# Patient Record
Sex: Female | Born: 1965 | Race: White | Hispanic: No | Marital: Married | Smoking: Current every day smoker
Health system: Southern US, Community
[De-identification: ages and names within clinical notes are randomized; demographics above are authoritative.]

## PROBLEM LIST (undated history)

## (undated) DIAGNOSIS — K259 Gastric ulcer, unspecified as acute or chronic, without hemorrhage or perforation: Secondary | ICD-10-CM

## (undated) DIAGNOSIS — E7800 Pure hypercholesterolemia, unspecified: Secondary | ICD-10-CM

## (undated) DIAGNOSIS — I10 Essential (primary) hypertension: Secondary | ICD-10-CM

## (undated) DIAGNOSIS — J45909 Unspecified asthma, uncomplicated: Secondary | ICD-10-CM

## (undated) DIAGNOSIS — N809 Endometriosis, unspecified: Secondary | ICD-10-CM

## (undated) HISTORY — PX: BACK SURGERY: SHX140

## (undated) HISTORY — DX: Gastric ulcer, unspecified as acute or chronic, without hemorrhage or perforation: K25.9

## (undated) HISTORY — PX: ABDOMINAL HYSTERECTOMY: SHX81

## (undated) HISTORY — PX: OTHER SURGICAL HISTORY: SHX169

## (undated) HISTORY — DX: Endometriosis, unspecified: N80.9

## (undated) HISTORY — DX: Essential (primary) hypertension: I10

## (undated) HISTORY — DX: Pure hypercholesterolemia, unspecified: E78.00

## (undated) HISTORY — DX: Unspecified asthma, uncomplicated: J45.909

---

## 1991-08-29 DIAGNOSIS — K259 Gastric ulcer, unspecified as acute or chronic, without hemorrhage or perforation: Secondary | ICD-10-CM

## 1991-08-29 HISTORY — DX: Gastric ulcer, unspecified as acute or chronic, without hemorrhage or perforation: K25.9

## 2005-03-21 ENCOUNTER — Ambulatory Visit: Payer: Self-pay | Admitting: Physician Assistant

## 2005-10-26 ENCOUNTER — Ambulatory Visit (HOSPITAL_COMMUNITY): Admission: RE | Admit: 2005-10-26 | Discharge: 2005-10-27 | Payer: Self-pay | Admitting: Neurosurgery

## 2006-01-29 ENCOUNTER — Ambulatory Visit (HOSPITAL_COMMUNITY): Admission: RE | Admit: 2006-01-29 | Discharge: 2006-01-29 | Payer: Self-pay | Admitting: Obstetrics and Gynecology

## 2006-04-25 ENCOUNTER — Ambulatory Visit (HOSPITAL_COMMUNITY): Admission: RE | Admit: 2006-04-25 | Discharge: 2006-04-25 | Payer: Self-pay | Admitting: Nurse Practitioner

## 2007-04-22 ENCOUNTER — Ambulatory Visit (HOSPITAL_COMMUNITY): Admission: RE | Admit: 2007-04-22 | Discharge: 2007-04-22 | Payer: Self-pay | Admitting: Obstetrics and Gynecology

## 2007-06-18 ENCOUNTER — Encounter: Admission: RE | Admit: 2007-06-18 | Discharge: 2007-06-18 | Payer: Self-pay | Admitting: Neurosurgery

## 2008-04-22 ENCOUNTER — Ambulatory Visit (HOSPITAL_COMMUNITY): Admission: RE | Admit: 2008-04-22 | Discharge: 2008-04-22 | Payer: Self-pay | Admitting: Obstetrics and Gynecology

## 2009-04-26 ENCOUNTER — Ambulatory Visit (HOSPITAL_COMMUNITY): Admission: RE | Admit: 2009-04-26 | Discharge: 2009-04-26 | Payer: Self-pay | Admitting: Obstetrics and Gynecology

## 2010-06-13 ENCOUNTER — Ambulatory Visit (HOSPITAL_COMMUNITY)
Admission: RE | Admit: 2010-06-13 | Discharge: 2010-06-13 | Payer: Self-pay | Source: Home / Self Care | Admitting: Nurse Practitioner

## 2011-01-13 NOTE — Op Note (Signed)
Jessica Mendez, Jessica Mendez                ACCOUNT NO.:  [id number]   MEDICAL RECORD NO.:  [id number]          PATIENT TYPE:  AMB   LOCATION:  SDS                          FACILITY:  [facility]   PHYSICIAN:  Hewitt Shorts, M.D.DATE OF BIRTH:  [birth year]   DATE OF PROCEDURE:  10/26/2005  DATE OF DISCHARGE:                                 OPERATIVE REPORT   PREOPERATIVE DIAGNOSIS:  Right L4-5 lumbar disk herniation, lumbar  degenerative disk disease, lumbar spondylosis and lumbar radiculopathy.   POSTOPERATIVE DIAGNOSIS:  Right L4-5 lumbar disk herniation, lumbar  degenerative disk disease, lumbar spondylosis and lumbar radiculopathy.   OPERATION PERFORMED:  Right L4-5 lumbar laminotomy and microdiskectomy with  microdissection.   SURGEON:  Hewitt Shorts, M.D.   ASSISTANT:  Stefani Dama, M.D.   ANESTHESIA:  General endotracheal.   INDICATIONS FOR PROCEDURE:  The patient is a 45 year old woman who presented  with a right lumbar radiculopathy.  MRI scan revealed a right L4-5 lumbar  disk herniation.  A decision was made to proceed with laminotomy and  microdiskectomy.   DESCRIPTION OF PROCEDURE:  The patient was brought to the operating room and  placed under general endotracheal anesthesia.  The patient was turned to a  prone position.  Lumbar region was prepped with Betadine soap and solution  and draped in sterile fashion.  The midline was infiltrated with local  anesthetic with epinephrine.  A localizing x-ray was taken.  The L4-5 level  was identified and a midline incision was made over the  L4-5 level and  carried down to the subcutaneous tissue.  Bipolar cautery and electrocautery  were used to maintain hemostasis.  Dissection was carried down to the lumbar  fascia which was incised on the right side of the midline and the paraspinal  muscles were dissected from the spinous process and lamina in subperiosteal  fashion.  Another x-ray was taken and the L4-5 interlaminar  space was  identified.  The microscope was draped and the remainder of the  decompression was performed using microdissection and microsurgical  technique. A laminotomy was performed using the X-Max drill and Kerrison  punches.  The ligamentum flavum was carefully removed and we identified the  thecal sac and right L5 nerve root.  These structures were gently retracted  medially.  A large disk herniation was identified.  In fact it was more  prominent than we anticipated based on the MRI findings.  The annulus was  incised and a large fragment removed.  Spondylitic overgrowth from the  posterior aspect of L4 to L5 was removed  and we entered into the disk space  and proceeded with thorough diskectomy using variety of microcurets and  pituitary rongeurs. Good decompression was achieved, all loose fragments of  disk material were removed from both the disk space and the epidural space  and good decompression of the thecal sac and nerve root was achieved.  Once  the diskectomy was completed, hemostasis was established with the use of  Gelfoam soaked in thrombin and bipolar cautery.  All the Gelfoam was removed  prior to closure.  The wound was irrigated numerous times through the  procedure with bacitracin solution.  It was checked for hemostasis at the  conclusion.  When good hemostasis was established, we then instilled 2 mL of  fentanyl and 80 mg of Depo-Medrol into the epidural space and proceeded with  closure.  The deep fascia was closed with interrupted undyed #1 Vicryl  sutures, the Scarpa's fascia closed with interrupted undyed #1 Vicryl  sutures, the subcutaneous and subcuticular layer were closed with  interrupted inverted 2-0 undyed Vicryl sutures and skin edges closed with  Dermabond.  The procedure was tolerated well.  The estimated blood loss for  this procedure was 25 mL.  Sponge, needle and instrument counts were  correct.  Following surgery the patient was turned back to  supine position  to be reversed from anesthetic, extubated and transferred to recovery room  for further care.      Hewitt Shorts, M.D.  Electronically Signed     RWN/MEDQ  D:  10/26/2005  T:  10/26/2005  Job:  [id number]

## 2011-06-08 ENCOUNTER — Other Ambulatory Visit (HOSPITAL_COMMUNITY): Payer: Self-pay | Admitting: Internal Medicine

## 2011-06-08 DIAGNOSIS — Z139 Encounter for screening, unspecified: Secondary | ICD-10-CM

## 2011-06-20 ENCOUNTER — Ambulatory Visit (HOSPITAL_COMMUNITY)
Admission: RE | Admit: 2011-06-20 | Discharge: 2011-06-20 | Disposition: A | Discharge status: Home / Self Care | Payer: Managed Care, Other (non HMO) | Source: Ambulatory Visit | Attending: Internal Medicine | Admitting: Internal Medicine

## 2011-06-20 DIAGNOSIS — Z1231 Encounter for screening mammogram for malignant neoplasm of breast: Secondary | ICD-10-CM | POA: Insufficient documentation

## 2011-06-20 DIAGNOSIS — Z139 Encounter for screening, unspecified: Secondary | ICD-10-CM

## 2012-03-12 ENCOUNTER — Ambulatory Visit (HOSPITAL_COMMUNITY)
Admission: RE | Admit: 2012-03-12 | Discharge: 2012-03-12 | Disposition: A | Discharge status: Home / Self Care | Payer: Managed Care, Other (non HMO) | Source: Ambulatory Visit | Attending: Nurse Practitioner | Admitting: Nurse Practitioner

## 2012-03-12 ENCOUNTER — Other Ambulatory Visit (HOSPITAL_COMMUNITY): Payer: Self-pay | Admitting: Nurse Practitioner

## 2012-03-12 DIAGNOSIS — R0600 Dyspnea, unspecified: Secondary | ICD-10-CM

## 2012-03-12 DIAGNOSIS — R0609 Other forms of dyspnea: Secondary | ICD-10-CM | POA: Insufficient documentation

## 2012-03-12 DIAGNOSIS — R0989 Other specified symptoms and signs involving the circulatory and respiratory systems: Secondary | ICD-10-CM | POA: Insufficient documentation

## 2012-06-20 ENCOUNTER — Other Ambulatory Visit: Payer: Self-pay | Admitting: Obstetrics and Gynecology

## 2012-06-20 DIAGNOSIS — Z139 Encounter for screening, unspecified: Secondary | ICD-10-CM

## 2012-06-25 ENCOUNTER — Ambulatory Visit (HOSPITAL_COMMUNITY)
Admission: RE | Admit: 2012-06-25 | Discharge: 2012-06-25 | Disposition: A | Discharge status: Home / Self Care | Payer: Managed Care, Other (non HMO) | Source: Ambulatory Visit | Attending: Obstetrics and Gynecology | Admitting: Obstetrics and Gynecology

## 2012-06-25 DIAGNOSIS — Z1231 Encounter for screening mammogram for malignant neoplasm of breast: Secondary | ICD-10-CM | POA: Insufficient documentation

## 2012-06-25 DIAGNOSIS — Z139 Encounter for screening, unspecified: Secondary | ICD-10-CM

## 2013-06-10 ENCOUNTER — Other Ambulatory Visit: Payer: Self-pay | Admitting: Obstetrics and Gynecology

## 2013-06-10 DIAGNOSIS — Z139 Encounter for screening, unspecified: Secondary | ICD-10-CM

## 2013-06-30 ENCOUNTER — Ambulatory Visit (HOSPITAL_COMMUNITY)
Admission: RE | Admit: 2013-06-30 | Discharge: 2013-06-30 | Disposition: A | Discharge status: Home / Self Care | Payer: BC Managed Care – PPO | Source: Ambulatory Visit | Attending: Obstetrics and Gynecology | Admitting: Obstetrics and Gynecology

## 2013-06-30 DIAGNOSIS — Z139 Encounter for screening, unspecified: Secondary | ICD-10-CM

## 2013-06-30 DIAGNOSIS — Z1231 Encounter for screening mammogram for malignant neoplasm of breast: Secondary | ICD-10-CM | POA: Insufficient documentation

## 2014-06-19 ENCOUNTER — Other Ambulatory Visit (HOSPITAL_COMMUNITY): Payer: Self-pay | Admitting: Nurse Practitioner

## 2014-06-19 DIAGNOSIS — Z1231 Encounter for screening mammogram for malignant neoplasm of breast: Secondary | ICD-10-CM

## 2014-07-06 ENCOUNTER — Ambulatory Visit (HOSPITAL_COMMUNITY)
Admission: RE | Admit: 2014-07-06 | Discharge: 2014-07-06 | Disposition: A | Discharge status: Home / Self Care | Payer: BC Managed Care – PPO | Source: Ambulatory Visit | Attending: Nurse Practitioner | Admitting: Nurse Practitioner

## 2014-07-06 DIAGNOSIS — R921 Mammographic calcification found on diagnostic imaging of breast: Secondary | ICD-10-CM | POA: Insufficient documentation

## 2014-07-06 DIAGNOSIS — Z1231 Encounter for screening mammogram for malignant neoplasm of breast: Secondary | ICD-10-CM | POA: Insufficient documentation

## 2014-07-07 ENCOUNTER — Other Ambulatory Visit: Payer: Self-pay | Admitting: Nurse Practitioner

## 2014-07-07 DIAGNOSIS — R928 Other abnormal and inconclusive findings on diagnostic imaging of breast: Secondary | ICD-10-CM

## 2014-07-17 ENCOUNTER — Ambulatory Visit
Admission: RE | Admit: 2014-07-17 | Discharge: 2014-07-17 | Disposition: A | Discharge status: Home / Self Care | Payer: BC Managed Care – PPO | Source: Ambulatory Visit | Attending: Nurse Practitioner | Admitting: Nurse Practitioner

## 2014-07-17 ENCOUNTER — Other Ambulatory Visit: Payer: Self-pay | Admitting: Nurse Practitioner

## 2014-07-17 DIAGNOSIS — N6320 Unspecified lump in the left breast, unspecified quadrant: Secondary | ICD-10-CM

## 2014-07-17 DIAGNOSIS — R928 Other abnormal and inconclusive findings on diagnostic imaging of breast: Secondary | ICD-10-CM

## 2014-12-22 ENCOUNTER — Other Ambulatory Visit: Payer: Self-pay | Admitting: Obstetrics and Gynecology

## 2014-12-22 DIAGNOSIS — R921 Mammographic calcification found on diagnostic imaging of breast: Secondary | ICD-10-CM

## 2015-01-18 ENCOUNTER — Ambulatory Visit
Admission: RE | Admit: 2015-01-18 | Discharge: 2015-01-18 | Disposition: A | Discharge status: Home / Self Care | Payer: BLUE CROSS/BLUE SHIELD | Source: Ambulatory Visit | Attending: Obstetrics and Gynecology | Admitting: Obstetrics and Gynecology

## 2015-01-18 DIAGNOSIS — R921 Mammographic calcification found on diagnostic imaging of breast: Secondary | ICD-10-CM

## 2015-06-18 ENCOUNTER — Other Ambulatory Visit: Payer: Self-pay | Admitting: Obstetrics and Gynecology

## 2015-06-18 DIAGNOSIS — R921 Mammographic calcification found on diagnostic imaging of breast: Secondary | ICD-10-CM

## 2015-07-12 ENCOUNTER — Ambulatory Visit
Admission: RE | Admit: 2015-07-12 | Discharge: 2015-07-12 | Disposition: A | Discharge status: Home / Self Care | Payer: BLUE CROSS/BLUE SHIELD | Source: Ambulatory Visit | Attending: Obstetrics and Gynecology | Admitting: Obstetrics and Gynecology

## 2015-07-12 ENCOUNTER — Other Ambulatory Visit: Payer: Self-pay | Admitting: Obstetrics and Gynecology

## 2015-07-12 DIAGNOSIS — R921 Mammographic calcification found on diagnostic imaging of breast: Secondary | ICD-10-CM

## 2015-07-15 ENCOUNTER — Other Ambulatory Visit: Payer: Self-pay | Admitting: Obstetrics and Gynecology

## 2015-07-15 DIAGNOSIS — R921 Mammographic calcification found on diagnostic imaging of breast: Secondary | ICD-10-CM

## 2015-07-16 ENCOUNTER — Ambulatory Visit
Admission: RE | Admit: 2015-07-16 | Discharge: 2015-07-16 | Disposition: A | Discharge status: Home / Self Care | Payer: BLUE CROSS/BLUE SHIELD | Source: Ambulatory Visit | Attending: Obstetrics and Gynecology | Admitting: Obstetrics and Gynecology

## 2015-07-16 DIAGNOSIS — R921 Mammographic calcification found on diagnostic imaging of breast: Secondary | ICD-10-CM

## 2016-02-22 ENCOUNTER — Telehealth: Payer: Self-pay

## 2016-02-22 NOTE — Telephone Encounter (Signed)
Gastroenterology Pre-Procedure Review  Request Date: 02/21/2016 Requesting Physician: Lucia EstelleFeng Zheng, FNP  PATIENT REVIEW QUESTIONS: The patient responded to the following health history questions as indicated:    1. Diabetes Melitis: no 2. Joint replacements in the past 12 months: no 3. Major health problems in the past 3 months: no 4. Has an artificial valve or MVP: no 5. Has a defibrillator: no 6. Has been advised in past to take antibiotics in advance of a procedure like teeth cleaning: no 7. Family history of colon cancer: no  8. Alcohol Use: 2-3 glasses of of wine at night 3-4 times a week 9. History of sleep apnea: no     MEDICATIONS & ALLERGIES:    Patient reports the following regarding taking any blood thinners:   Plavix? no Aspirin? no Coumadin? no  Patient confirms/reports the following medications:  Current Outpatient Prescriptions  Medication Sig Dispense Refill  . albuterol (PROVENTIL HFA;VENTOLIN HFA) 108 (90 Base) MCG/ACT inhaler Inhale into the lungs every 6 (six) hours as needed for wheezing or shortness of breath.     No current facility-administered medications for this visit.    Patient confirms/reports the following allergies:  Allergies  Allergen Reactions  . Other     SULPHITES  . Penicillins     No orders of the defined types were placed in this encounter.    AUTHORIZATION INFORMATION Primary Insurance:   ID #:  Group #:  Pre-Cert / Auth required:  Pre-Cert / Auth #:   Secondary Insurance:   ID # Group #:  Pre-Cert / Auth required: Pre-Cert / Auth #:   SCHEDULE INFORMATION: Procedure has been scheduled as follows:  Date:                     Time:  Location:   This Gastroenterology Pre-Precedure Review Form is being routed to the following provider(s): R. Roetta SessionsMichael Rourk, MD

## 2016-02-23 NOTE — Telephone Encounter (Signed)
Will need OV to schedule due to possible need for augmented sedation with ETOH consumption.

## 2016-02-23 NOTE — Telephone Encounter (Signed)
Pt is aware and has been scheduled for OV with Wynne DustEric Gill, NP on 04/03/2016 at 10:30 AM.

## 2016-02-23 NOTE — Telephone Encounter (Signed)
She also said to make to put in her chart that she is allergic to preservatives. It has been entered.

## 2016-04-03 ENCOUNTER — Ambulatory Visit: Payer: BLUE CROSS/BLUE SHIELD | Admitting: Nurse Practitioner

## 2016-05-02 DIAGNOSIS — J449 Chronic obstructive pulmonary disease, unspecified: Secondary | ICD-10-CM | POA: Diagnosis not present

## 2016-05-18 DIAGNOSIS — J449 Chronic obstructive pulmonary disease, unspecified: Secondary | ICD-10-CM | POA: Diagnosis not present

## 2016-05-22 DIAGNOSIS — J449 Chronic obstructive pulmonary disease, unspecified: Secondary | ICD-10-CM | POA: Diagnosis not present

## 2016-05-31 DIAGNOSIS — J449 Chronic obstructive pulmonary disease, unspecified: Secondary | ICD-10-CM | POA: Diagnosis not present

## 2016-06-12 DIAGNOSIS — J449 Chronic obstructive pulmonary disease, unspecified: Secondary | ICD-10-CM | POA: Diagnosis not present

## 2016-06-13 DIAGNOSIS — I10 Essential (primary) hypertension: Secondary | ICD-10-CM | POA: Diagnosis not present

## 2016-06-13 DIAGNOSIS — J019 Acute sinusitis, unspecified: Secondary | ICD-10-CM | POA: Diagnosis not present

## 2016-06-16 ENCOUNTER — Other Ambulatory Visit (HOSPITAL_COMMUNITY): Payer: Self-pay | Admitting: Nurse Practitioner

## 2016-06-16 DIAGNOSIS — Z1231 Encounter for screening mammogram for malignant neoplasm of breast: Secondary | ICD-10-CM

## 2016-06-20 DIAGNOSIS — J449 Chronic obstructive pulmonary disease, unspecified: Secondary | ICD-10-CM | POA: Diagnosis not present

## 2016-06-30 DIAGNOSIS — J449 Chronic obstructive pulmonary disease, unspecified: Secondary | ICD-10-CM | POA: Diagnosis not present

## 2016-07-10 DIAGNOSIS — Z79899 Other long term (current) drug therapy: Secondary | ICD-10-CM | POA: Diagnosis not present

## 2016-07-10 DIAGNOSIS — I10 Essential (primary) hypertension: Secondary | ICD-10-CM | POA: Diagnosis not present

## 2016-07-10 DIAGNOSIS — R7301 Impaired fasting glucose: Secondary | ICD-10-CM | POA: Diagnosis not present

## 2016-07-10 DIAGNOSIS — E782 Mixed hyperlipidemia: Secondary | ICD-10-CM | POA: Diagnosis not present

## 2016-07-10 DIAGNOSIS — Z23 Encounter for immunization: Secondary | ICD-10-CM | POA: Diagnosis not present

## 2016-07-12 ENCOUNTER — Ambulatory Visit (HOSPITAL_COMMUNITY): Payer: BLUE CROSS/BLUE SHIELD

## 2016-07-13 DIAGNOSIS — J449 Chronic obstructive pulmonary disease, unspecified: Secondary | ICD-10-CM | POA: Diagnosis not present

## 2016-07-17 DIAGNOSIS — J449 Chronic obstructive pulmonary disease, unspecified: Secondary | ICD-10-CM | POA: Diagnosis not present

## 2016-07-19 ENCOUNTER — Ambulatory Visit (HOSPITAL_COMMUNITY)
Admission: RE | Admit: 2016-07-19 | Discharge: 2016-07-19 | Disposition: A | Discharge status: Home / Self Care | Payer: BLUE CROSS/BLUE SHIELD | Source: Ambulatory Visit | Attending: Nurse Practitioner | Admitting: Nurse Practitioner

## 2016-07-19 DIAGNOSIS — Z1231 Encounter for screening mammogram for malignant neoplasm of breast: Secondary | ICD-10-CM | POA: Insufficient documentation

## 2016-07-26 DIAGNOSIS — J0110 Acute frontal sinusitis, unspecified: Secondary | ICD-10-CM | POA: Diagnosis not present

## 2016-08-14 DIAGNOSIS — J449 Chronic obstructive pulmonary disease, unspecified: Secondary | ICD-10-CM | POA: Diagnosis not present

## 2016-09-04 DIAGNOSIS — J449 Chronic obstructive pulmonary disease, unspecified: Secondary | ICD-10-CM | POA: Diagnosis not present

## 2016-09-18 DIAGNOSIS — J309 Allergic rhinitis, unspecified: Secondary | ICD-10-CM | POA: Diagnosis not present

## 2016-09-18 DIAGNOSIS — J449 Chronic obstructive pulmonary disease, unspecified: Secondary | ICD-10-CM | POA: Diagnosis not present

## 2016-09-18 DIAGNOSIS — R0902 Hypoxemia: Secondary | ICD-10-CM | POA: Diagnosis not present

## 2016-09-18 DIAGNOSIS — R0602 Shortness of breath: Secondary | ICD-10-CM | POA: Diagnosis not present

## 2016-09-20 DIAGNOSIS — J449 Chronic obstructive pulmonary disease, unspecified: Secondary | ICD-10-CM | POA: Diagnosis not present

## 2016-10-04 DIAGNOSIS — J449 Chronic obstructive pulmonary disease, unspecified: Secondary | ICD-10-CM | POA: Diagnosis not present

## 2016-10-16 DIAGNOSIS — J449 Chronic obstructive pulmonary disease, unspecified: Secondary | ICD-10-CM | POA: Diagnosis not present

## 2016-10-17 DIAGNOSIS — J449 Chronic obstructive pulmonary disease, unspecified: Secondary | ICD-10-CM | POA: Diagnosis not present

## 2016-10-18 DIAGNOSIS — J0191 Acute recurrent sinusitis, unspecified: Secondary | ICD-10-CM | POA: Diagnosis not present

## 2016-10-23 ENCOUNTER — Ambulatory Visit (INDEPENDENT_AMBULATORY_CARE_PROVIDER_SITE_OTHER): Payer: BLUE CROSS/BLUE SHIELD | Admitting: Adult Health

## 2016-10-23 ENCOUNTER — Encounter: Payer: Self-pay | Admitting: Adult Health

## 2016-10-23 VITALS — BP 140/78 | HR 84 | Ht 65.0 in | Wt 221.0 lb

## 2016-10-23 DIAGNOSIS — R319 Hematuria, unspecified: Secondary | ICD-10-CM

## 2016-10-23 DIAGNOSIS — G479 Sleep disorder, unspecified: Secondary | ICD-10-CM | POA: Diagnosis not present

## 2016-10-23 DIAGNOSIS — R61 Generalized hyperhidrosis: Secondary | ICD-10-CM

## 2016-10-23 DIAGNOSIS — R232 Flushing: Secondary | ICD-10-CM | POA: Diagnosis not present

## 2016-10-23 DIAGNOSIS — R6882 Decreased libido: Secondary | ICD-10-CM

## 2016-10-23 DIAGNOSIS — N3946 Mixed incontinence: Secondary | ICD-10-CM

## 2016-10-23 LAB — POCT URINALYSIS DIPSTICK
GLUCOSE UA: NEGATIVE
Ketones, UA: NEGATIVE
Leukocytes, UA: NEGATIVE
Nitrite, UA: NEGATIVE
PROTEIN UA: NEGATIVE

## 2016-10-23 MED ORDER — ESTRADIOL 1 MG PO TABS: 1.0000 mg | ORAL_TABLET | Freq: Every day | ORAL | 3 refills | Status: DC

## 2016-10-23 MED ORDER — OXYBUTYNIN CHLORIDE ER 10 MG PO TB24: 10.0000 mg | ORAL_TABLET | Freq: Every day | ORAL | 3 refills | Status: DC

## 2016-10-23 NOTE — Patient Instructions (Addendum)
Decrease smoking  Decrease caffeine Decrease alcohol Void before going to bed  Do kegels Follow up with me in 2 months  Menopause Menopause is the normal time of life when menstrual periods stop completely. Menopause is complete when you have missed 12 consecutive menstrual periods. It usually occurs between the ages of 48 years and 55 years. Very rarely does a woman develop menopause before the age of 40 years. At menopause, your ovaries stop producing the female hormones estrogen and progesterone. This can cause undesirable symptoms and also affect your health. Sometimes the symptoms may occur 4-5 years before the menopause begins. There is no relationship between menopause and:  Oral contraceptives.  Number of children you had.  Race.  The age your menstrual periods started (menarche). Heavy smokers and very thin women may develop menopause earlier in life. What are the causes?  The ovaries stop producing the female hormones estrogen and progesterone. Other causes include:  Surgery to remove both ovaries.  The ovaries stop functioning for no known reason.  Tumors of the pituitary gland in the brain.  Medical disease that affects the ovaries and hormone production.  Radiation treatment to the abdomen or pelvis.  Chemotherapy that affects the ovaries. What are the signs or symptoms?  Hot flashes.  Night sweats.  Decrease in sex drive.  Vaginal dryness and thinning of the vagina causing painful intercourse.  Dryness of the skin and developing wrinkles.  Headaches.  Tiredness.  Irritability.  Memory problems.  Weight gain.  Bladder infections.  Hair growth of the face and chest.  Infertility. More serious symptoms include:  Loss of bone (osteoporosis) causing breaks (fractures).  Depression.  Hardening and narrowing of the arteries (atherosclerosis) causing heart attacks and strokes. How is this diagnosed?  When the menstrual periods have stopped  for 12 straight months.  Physical exam.  Hormone studies of the blood. How is this treated? There are many treatment choices and nearly as many questions about them. The decisions to treat or not to treat menopausal changes is an individual choice made with your health care provider. Your health care provider can discuss the treatments with you. Together, you can decide which treatment will work best for you. Your treatment choices may include:  Hormone therapy (estrogen and progesterone).  Non-hormonal medicines.  Treating the individual symptoms with medicine (for example antidepressants for depression).  Herbal medicines that may help specific symptoms.  Counseling by a psychiatrist or psychologist.  Group therapy.  Lifestyle changes including:  Eating healthy.  Regular exercise.  Limiting caffeine and alcohol.  Stress management and meditation.  No treatment. Follow these instructions at home:  Take the medicine your health care provider gives you as directed.  Get plenty of sleep and rest.  Exercise regularly.  Eat a diet that contains calcium (good for the bones) and soy products (acts like estrogen hormone).  Avoid alcoholic beverages.  Do not smoke.  If you have hot flashes, dress in layers.  Take supplements, calcium, and vitamin D to strengthen bones.  You can use over-the-counter lubricants or moisturizers for vaginal dryness.  Group therapy is sometimes very helpful.  Acupuncture may be helpful in some cases. Contact a health care provider if:  You are not sure you are in menopause.  You are having menopausal symptoms and need advice and treatment.  You are still having menstrual periods after age 65 years.  You have pain with intercourse.  Menopause is complete (no menstrual period for 12 months) and you develop  vaginal bleeding.  You need a referral to a specialist (gynecologist, psychiatrist, or psychologist) for treatment. Get help  right away if:  You have severe depression.  You have excessive vaginal bleeding.  You fell and think you have a broken bone.  You have pain when you urinate.  You develop leg or chest pain.  You have a fast pounding heart beat (palpitations).  You have severe headaches.  You develop vision problems.  You feel a lump in your breast.  You have abdominal pain or severe indigestion. This information is not intended to replace advice given to you by your health care provider. Make sure you discuss any questions you have with your health care provider. Document Released: 11/04/2003 Document Revised: 01/20/2016 Document Reviewed: 03/13/2013 Elsevier Interactive Patient Education  2017 [employer] Kegel Exercises The goal of Kegel exercises is to isolate and exercise your pelvic floor muscles. These muscles act as a hammock that supports the rectum, vagina, small intestine, and uterus. As the muscles weaken, the hammock sags and these organs are displaced from their normal positions. Kegel exercises can strengthen your pelvic floor muscles and help you to improve bladder and bowel control, improve sexual response, and help reduce many problems and some discomfort during pregnancy. Kegel exercises can be done anywhere and at any time. HOW TO PERFORM KEGEL EXERCISES 1. Locate your pelvic floor muscles. To do this, squeeze (contract) the muscles that you use when you try to stop the flow of urine. You will feel a tightness in the vaginal area (women) and a tight lift in the rectal area (men and women). 2. When you begin, contract your pelvic muscles tight for 2-5 seconds, then relax them for 2-5 seconds. This is one set. Do 4-5 sets with a short pause in between. 3. Contract your pelvic muscles for 8-10 seconds, then relax them for 8-10 seconds. Do 4-5 sets. If you cannot contract your pelvic muscles for 8-10 seconds, try 5-7 seconds and work your way up to 8-10 seconds. Your goal is 4-5 sets  of 10 contractions each day. Keep your stomach, buttocks, and legs relaxed during the exercises. Perform sets of both short and long contractions. Vary your positions. Perform these contractions 3-4 times per day. Perform sets while you are:   Lying in bed in the morning.  Standing at lunch.  Sitting in the late afternoon.  Lying in bed at night. You should do 40-50 contractions per day. Do not perform more Kegel exercises per day than recommended. Overexercising can cause muscle fatigue. Continue these exercises for for at least 15-20 weeks or as directed by your caregiver. This information is not intended to replace advice given to you by your health care provider. Make sure you discuss any questions you have with your health care provider. Document Released: 07/31/2012 Document Revised: 09/04/2014 Document Reviewed: 07/04/2015 Elsevier Interactive Patient Education  2017 [employer]

## 2016-10-23 NOTE — Progress Notes (Signed)
Subjective:     Patient ID: Jessica ReichmannLisa M Mendez, female   DOB: 10-05-1965, 51 y.o.   MRN: [id number]  HPI Jessica StanleyLisa is a 51 year old white female, married, sp hysterectomy in complaining of bladder leaking when coughs or sneezes, or has to go, may be up 2-3 x during the night to pee, has decreased sex drive, no pain with sex.Also hot flashes for 2 years and night sweats and does not sleep well. No itching or burning.She is sp hysterectomy but has 1 ovary left.And she is a smoker. [facility]  Review of Systems +bladder leaks when coughs,sneezes, or if has to go May be up at night 2-3 x to pee Hot flashes and night sweats Not sleeping good  +vaginal discharge at times,no itching or burning  Decrease sex drive,no pain with sex Reviewed past medical,surgical, social and family history. Reviewed medications and allergies.     Objective:   Physical Exam BP 140/78 (BP Location: Left Arm, Patient Position: Sitting, Cuff Size: Large)   Pulse 84   Ht 5\' 5"  (1.651 m)   Wt 221 lb (100.2 kg)   BMI 36.78 kg/m Urine dipstick trace blood. PHQ 2 score 0. Skin warm and dry.Pelvic: external genitalia is normal in appearance no lesions, vagina: scant white discharge without odor,urethra has no lesions or masses noted, cervix and uterus are absent, adnexa: no masses or tenderness noted. Bladder is non tender and no masses felt.   Discussed adding estrogen and ditropan and kegels, and she agrees, is aware of risks and benefits, and that getting sex back is the hardest to accomplish. May add testosterone if not better at F/U. Face time 20 minutes with 50% counseling and discuss options.  Assessment:     1. Mixed stress and urge urinary incontinence   2. Hot flashes   3. Hematuria, unspecified type   4. Night sweats   5. Sleep disturbance   6. Decreased sex drive       Plan:    Rx estrace 1 mg #30 take 1 daily with 3 refills Rx ditropan XL 10 mg take 1 at hs UA C&S sent Follow up in 2  months Decrease smoking  Decrease caffeine Decrease alcohol Void before  going to bed  Do kegels Have sex at least once a week after being on estrace for at least 2 weeks Review handout on kegels and menopause

## 2016-10-24 LAB — URINALYSIS, ROUTINE W REFLEX MICROSCOPIC
Bilirubin, UA: NEGATIVE
Glucose, UA: NEGATIVE
Ketones, UA: NEGATIVE
Leukocytes, UA: NEGATIVE
Nitrite, UA: NEGATIVE
Protein, UA: NEGATIVE
RBC, UA: NEGATIVE
Specific Gravity, UA: 1.009 (ref 1.005–1.030)
Urobilinogen, Ur: 0.2 mg/dL (ref 0.2–1.0)
pH, UA: 8 — ABNORMAL HIGH (ref 5.0–7.5)

## 2016-10-25 ENCOUNTER — Telehealth: Payer: Self-pay | Admitting: Adult Health

## 2016-10-25 LAB — URINE CULTURE

## 2016-10-25 NOTE — Telephone Encounter (Signed)
Pt aware urine culture did not grow anaything

## 2016-10-30 DIAGNOSIS — J449 Chronic obstructive pulmonary disease, unspecified: Secondary | ICD-10-CM | POA: Diagnosis not present

## 2016-11-09 DIAGNOSIS — J449 Chronic obstructive pulmonary disease, unspecified: Secondary | ICD-10-CM | POA: Diagnosis not present

## 2016-11-13 DIAGNOSIS — J449 Chronic obstructive pulmonary disease, unspecified: Secondary | ICD-10-CM | POA: Diagnosis not present

## 2016-11-27 DIAGNOSIS — J449 Chronic obstructive pulmonary disease, unspecified: Secondary | ICD-10-CM | POA: Diagnosis not present

## 2016-12-04 DIAGNOSIS — J449 Chronic obstructive pulmonary disease, unspecified: Secondary | ICD-10-CM | POA: Diagnosis not present

## 2016-12-11 DIAGNOSIS — J449 Chronic obstructive pulmonary disease, unspecified: Secondary | ICD-10-CM | POA: Diagnosis not present

## 2016-12-21 DIAGNOSIS — J449 Chronic obstructive pulmonary disease, unspecified: Secondary | ICD-10-CM | POA: Diagnosis not present

## 2016-12-25 ENCOUNTER — Encounter: Payer: Self-pay | Admitting: Adult Health

## 2016-12-25 ENCOUNTER — Ambulatory Visit (INDEPENDENT_AMBULATORY_CARE_PROVIDER_SITE_OTHER): Payer: BLUE CROSS/BLUE SHIELD | Admitting: Adult Health

## 2016-12-25 VITALS — BP 128/82 | HR 70 | Ht 65.0 in | Wt 221.0 lb

## 2016-12-25 DIAGNOSIS — N3946 Mixed incontinence: Secondary | ICD-10-CM | POA: Diagnosis not present

## 2016-12-25 DIAGNOSIS — J449 Chronic obstructive pulmonary disease, unspecified: Secondary | ICD-10-CM | POA: Diagnosis not present

## 2016-12-25 MED ORDER — ESTRADIOL 1 MG PO TABS: 1.0000 mg | ORAL_TABLET | Freq: Every day | ORAL | 12 refills | Status: DC

## 2016-12-25 MED ORDER — TOLTERODINE TARTRATE ER 4 MG PO CP24: 4.0000 mg | ORAL_CAPSULE | Freq: Every day | ORAL | 1 refills | Status: DC

## 2016-12-25 NOTE — Progress Notes (Signed)
Subjective:     Patient ID: Jessica Mendez, female   DOB: 01/05/1966, 51 y.o.   MRN: [id number]  HPI Jessica Mendez is a 51 year old white female, back in follow up of starting estrogen and ditropan.Hot flashes and night sweats gone and sleeping better, but still has UI, may even but worse esp with coughing and sneezing.Has dry mouth with ditropan.  Review of Systems +urinary incontinence esp with coughing and sneezing  Dry mouth Hot flashes and night sweats gone, sleeping better, and working on sex  Reviewed past medical,surgical, social and family history. Reviewed medications and allergies.     Objective:   Physical Exam BP 128/82 (BP Location: Right Arm, Patient Position: Sitting, Cuff Size: Normal)   Pulse 70   Ht  (1.651 m)   Wt 221 lb (100.2 kg)   BMI 36.78 kg/m  Skin warm and dry.Lungs: clear to ausculation bilaterally. Cardiovascular: regular rate and rhythm.   Will stop ditropan and try detrol if no help then myrbetriq, or urology referral.  Assessment:     1. Mixed stress and urge urinary incontinence       Plan:     Meds ordered this encounter  Medications  . tolterodine (DETROL LA) 4 MG 24 hr capsule    Sig: Take 1 capsule (4 mg total) by mouth daily.    Dispense:  30 capsule    Refill:  1    Order Specific Question:   Supervising Provider    Answer:   Despina Hidden, LUTHER H [[id number]]  . estradiol (ESTRACE) 1 MG tablet    Sig: Take 1 tablet (1 mg total) by mouth daily.    Dispense:  30 tablet    Refill:  12    Order Specific Question:   Supervising Provider    Answer:   Duane Lope H [[id number]]  Stop oxybutynin and start detrol Do kegels still  Follow up in 7 weeks

## 2017-01-03 DIAGNOSIS — J449 Chronic obstructive pulmonary disease, unspecified: Secondary | ICD-10-CM | POA: Diagnosis not present

## 2017-01-08 DIAGNOSIS — J449 Chronic obstructive pulmonary disease, unspecified: Secondary | ICD-10-CM | POA: Diagnosis not present

## 2017-01-17 DIAGNOSIS — R1084 Generalized abdominal pain: Secondary | ICD-10-CM | POA: Diagnosis not present

## 2017-01-17 DIAGNOSIS — R197 Diarrhea, unspecified: Secondary | ICD-10-CM | POA: Diagnosis not present

## 2017-01-19 DIAGNOSIS — R599 Enlarged lymph nodes, unspecified: Secondary | ICD-10-CM | POA: Diagnosis not present

## 2017-01-19 DIAGNOSIS — R911 Solitary pulmonary nodule: Secondary | ICD-10-CM | POA: Diagnosis not present

## 2017-01-19 DIAGNOSIS — R197 Diarrhea, unspecified: Secondary | ICD-10-CM | POA: Diagnosis not present

## 2017-01-19 DIAGNOSIS — R1084 Generalized abdominal pain: Secondary | ICD-10-CM | POA: Diagnosis not present

## 2017-01-23 DIAGNOSIS — J449 Chronic obstructive pulmonary disease, unspecified: Secondary | ICD-10-CM | POA: Diagnosis not present

## 2017-01-24 ENCOUNTER — Encounter: Payer: Self-pay | Admitting: Internal Medicine

## 2017-01-29 ENCOUNTER — Ambulatory Visit (INDEPENDENT_AMBULATORY_CARE_PROVIDER_SITE_OTHER): Payer: BLUE CROSS/BLUE SHIELD | Admitting: Nurse Practitioner

## 2017-01-29 ENCOUNTER — Other Ambulatory Visit: Payer: Self-pay

## 2017-01-29 ENCOUNTER — Encounter: Payer: Self-pay | Admitting: Nurse Practitioner

## 2017-01-29 DIAGNOSIS — R109 Unspecified abdominal pain: Secondary | ICD-10-CM | POA: Insufficient documentation

## 2017-01-29 DIAGNOSIS — R1084 Generalized abdominal pain: Secondary | ICD-10-CM | POA: Diagnosis not present

## 2017-01-29 DIAGNOSIS — K921 Melena: Secondary | ICD-10-CM

## 2017-01-29 DIAGNOSIS — R197 Diarrhea, unspecified: Secondary | ICD-10-CM

## 2017-01-29 DIAGNOSIS — J449 Chronic obstructive pulmonary disease, unspecified: Secondary | ICD-10-CM | POA: Diagnosis not present

## 2017-01-29 MED ORDER — PEG 3350-KCL-NA BICARB-NACL 420 G PO SOLR: 4000.0000 mL | ORAL | 0 refills | Status: DC

## 2017-01-29 NOTE — Assessment & Plan Note (Signed)
The patient became ill about 2 weeks ago, now starting her third week of illness. Initially she had significant GI cramping. However, after treatment from her primary care her cramping is essentially resolved. States she has "occasional twinges" but no overt pain. Further workup as per below. Return for follow-up in 3 months.

## 2017-01-29 NOTE — Progress Notes (Signed)
Primary Care Physician:  Erasmo Downer, NP Primary Gastroenterologist:  Dr. Jena Gauss  Chief Complaint  Patient presents with  . Diarrhea    had tcs >20 yrs ago  . abnormal CT  . Nausea    has to take Zofran to eat  . Bloated    after eating    HPI:   Jessica Mendez is a 51 y.o. female who presents on evaluation from primary care for abnormal CT and diarrhea. PCP notes reviewed. Patient last saw primary care on 01/17/2017 at which point she described vomiting and diarrhea with vomiting subsiding but continued with diarrhea every 1-2 hours as well as abdominal cramping pain which is getting worse and a low-grade fever. Stools have been black but has recently taken Pepto-Bismol for pain. Taking sips liquids but otherwise no significant oral intake. Recommended colonoscopy but the patient had not scheduled as of 09/13/2016. Stat CT of the abdomen and pelvis found enteritis involving the mid to distal portions of the ileum with differential considerations being inflammatory versus infectious etiologies, does not appear to be enhancement of the wall and vascular compromise cannot be excluded though is of lower differential consideration. No associated loculated fluid collections or free air, free fluid is demonstrated within the lower abdomen and pelvis. Noted hepatic steatosis. Likely reactive peripancreatic lymph nodes.  Labs also reviewed and CBC found hemoglobin normal at 15.7 white blood cell count normal at 7.2, CMP with normal creatinine 0.79 mildly elevated AST at 49 but normal ALT and other liver labs. Lipase normal at 172. Lipid panel elevated/high risk across the board. Hemoglobin A1c 5.3.  Today she states she's not feeling good, has been feeling bad for about 2-3 weeks. The first weekl included vomiting, diarrhea, and abdominal cramping. Was put on antibiotic and cramps improved, diarrhea somewhat improved but not resolved. Still having nausea but no vomiting. Zofran helps her  nausea. When she eats feels bloated and "miserable." Denies recent fevers, but did have a low grade fever when she first fell ill. Denies hematochezia. Did have black stools "for a while" (on and off for a year or more) and during her recent illness did take Pepto Bismol twice. Denies unintentional weight loss. Prior to illness had variable stools from soft/formed to loose. Had a colonoscopy in [place], [place] around 20-30 years ago in addition to EGD and found a stomach ulcer. Denies NSAIDs, takes BC powder "every now and then" about 2 times a week. Denies chest pain, dyspnea, dizziness, lightheadedness, syncope, near syncope. Denies any other upper or lower GI symptoms.  Past Medical History:  Diagnosis Date  . Asthma   . Endometriosis   . Hypertension     Past Surgical History:  Procedure Laterality Date  . ABDOMINAL HYSTERECTOMY    . BACK SURGERY    . CESAREAN SECTION    . Sinus Surgeries     x 5    Current Outpatient Prescriptions  Medication Sig Dispense Refill  . EPINEPHrine 0.3 mg/0.3 mL IJ SOAJ injection Inject 0.3 mg into the muscle.    . estradiol (ESTRACE) 1 MG tablet Take 1 tablet (1 mg total) by mouth daily. 30 tablet 12  . Fluticasone-Salmeterol (ADVAIR DISKUS) 500-50 MCG/DOSE AEPB Inhale 1 puff into the lungs 2 (two) times daily.     . hydrochlorothiazide (MICROZIDE) 12.5 MG capsule Take 12.5 mg by mouth daily.   4  . ondansetron (ZOFRAN-ODT) 8 MG disintegrating tablet Take 8 mg by mouth as needed.  0  .  PROAIR RESPICLICK 108 (90 Base) MCG/ACT AEPB 2 puffs as needed.   4  . tolterodine (DETROL LA) 4 MG 24 hr capsule Take 1 capsule (4 mg total) by mouth daily. 30 capsule 1  . UNABLE TO FIND Allergy shots-every 2 weeks     No current facility-administered medications for this visit.     Allergies as of 01/29/2017 - Review Complete 01/29/2017  Allergen Reaction Noted  . Other  02/22/2016  . Penicillins  02/22/2016  . Procaine Shortness Of Breath and Swelling 09/21/2014    . Sulfa antibiotics  09/21/2014    Family History  Problem Relation Age of Onset  . Heart attack Paternal Grandfather   . Cancer Paternal Grandmother   . Colon cancer Maternal Grandmother   . COPD Maternal Grandfather   . Lymphoma Father   . Hypertension Father   . Hypertension Mother   . Asthma Sister   . Other Sister        back surgery  . Hypertension Sister     Social History   Social History  . Marital status: Married    Spouse name: N/A  . Number of children: N/A  . Years of education: N/A   Occupational History  . Not on file.   Social History Main Topics  . Smoking status: Current Every Day Smoker    Packs/day: 1.00    Years: 20.00    Types: Cigarettes  . Smokeless tobacco: Never Used  . Alcohol use Yes     Comment: occ  . Drug use: No  . Sexual activity: Yes    Birth control/ protection: Surgical     Comment: hyst   Other Topics Concern  . Not on file   Social History Narrative  . No narrative on file    Review of Systems: General: Negative for anorexia, weight loss, fever, chills, fatigue, weakness. ENT: Negative for hoarseness, difficulty swallowing. CV: Negative for chest pain, angina, palpitations, peripheral edema.  Respiratory: Negative for dyspnea at rest, cough, sputum, wheezing.  GI: See history of present illness. MS: Negative for joint pain, low back pain.  Derm: Negative for rash or itching.  Endo: Negative for unusual weight change.  Heme: Negative for bruising or bleeding. Allergy: Negative for rash or hives.    Physical Exam: BP 136/88   Pulse 71   Temp 97 F (36.1 C) (Oral)   Ht 5\' 5"  (1.651 m)   Wt 214 lb 9.6 oz (97.3 kg)   BMI 35.71 kg/m  General:   Obese female. Alert and oriented. Pleasant and cooperative. Well-nourished and well-developed.  Head:  Normocephalic and atraumatic. Eyes:  Without icterus, sclera clear and conjunctiva pink.  Ears:  Normal auditory acuity. Cardiovascular:  S1, S2 present without  murmurs appreciated. Extremities without clubbing or edema. Respiratory:  Clear to auscultation bilaterally. No wheezes, rales, or rhonchi. No distress.  Gastrointestinal:  +BS, soft, non-tender and non-distended. No HSM noted. No guarding or rebound. No masses appreciated.  Rectal:  Deferred  Musculoskalatal:  Symmetrical without gross deformities. Neurologic:  Grossly normal neurologically. Psych:  Alert and cooperative. Normal mood and affect. Heme/Lymph/Immune: No excessive bruising noted.    01/29/2017 8:59 AM   Disclaimer: This note was dictated with voice recognition software. Similar sounding words can inadvertently be transcribed and may not be corrected upon review.

## 2017-01-29 NOTE — Assessment & Plan Note (Addendum)
The patient describes black stools on and off for the past year. Has a history of gastric ulcer about 25 years ago. She did take 2 doses of Pepto-Bismol during her recent acute illness but has had the black stools intermittently for longer than that. She does take BC powder about twice a week. I have counseled her to stop NSAIDs and aspirin powders as these can cause ulcers and other complications. At this time, given that she is due for colonoscopy, we will set up an upper endoscopy for further evaluation as well. She may need to be placed on a PPI pending results of her EGD. Labs provided by primary care indicate her hemoglobin is normal and she has not noted any blood in her stools, only "black stools" as described above. Return for follow-up in 3 months.  Proceed with EGD with Dr. Jena Gaussourk in near future: the risks, benefits, and alternatives have been discussed with the patient in detail. The patient states understanding and desires to proceed.  The patient is not on any anticoagulants, anxiolytics, chronic pain medications, or antidepressants. Conscious sedation should be adequate for her procedure  She has special sedation requirements of sedation with no preservatives due to allergies.

## 2017-01-29 NOTE — Assessment & Plan Note (Addendum)
The patient has continued diarrhea about twice a day. It is improved since when she initially became ill since she has been treated by her primary care. CT of the abdomen indicates likely gastroenteritis. I will have her bring a stool sample to the lab check a GI pathogen panel. If no persistent infection she can start Imodium as needed. Also recommend probiotic once a day for the next 1-2 months. She is 51 years old and is due for a colonoscopy at this point. We will order the colonoscopy which will allow further evaluation of her colon. She has special sedation requirements in that she is allergic to the preservatives and sedation and we will follow-up with endoscopy about this. Return for follow-up in 3 months.  Proceed with TCS with Dr. Jena Gaussourk in near future: the risks, benefits, and alternatives have been discussed with the patient in detail. The patient states understanding and desires to proceed.  The patient is not on any anticoagulants, anxiolytics, chronic pain medications, or antidepressants. Conscious sedation should be adequate for her procedure.

## 2017-01-29 NOTE — Progress Notes (Signed)
CC'ED TO PCP 

## 2017-01-29 NOTE — Patient Instructions (Addendum)
1. Take a probiotic daily for the next 30-60 days. 2. I am providing you with samples to last 10 days and a coupon to help with the over-the-counter cost. 3. We will give you cups to collect a liquid stool sample. Bring this back to the lab (not our office) when it is completed. 4. We will schedule your procedures for you. 5. We will notify endoscopy of her allergies to sedation medicine with preservatives. 6. As we discussed, diet and exercise of the standard treatments for fatty liver disease. Your CT indicates she likely due to have fatty liver disease. Further information as below. 7. Return for follow-up in 3 months.    Fatty Liver Fatty liver, also called hepatic steatosis or steatohepatitis, is a condition in which too much fat has built up in your liver cells. The liver removes harmful substances from your bloodstream. It produces fluids your body needs. It also helps your body use and store energy from the food you eat. In many cases, fatty liver does not cause symptoms or problems. It is often diagnosed when tests are being done for other reasons. However, over time, fatty liver can cause inflammation that may lead to more serious liver problems, such as scarring of the liver (cirrhosis). What are the causes? Causes of fatty liver may include:  Drinking too much alcohol.  Poor nutrition.  Obesity.  Cushing syndrome.  Diabetes.  Hyperlipidemia.  Pregnancy.  Certain drugs.  Poisons.  Some viral infections.  What increases the risk? You may be more likely to develop fatty liver if you:  Abuse alcohol.  Are pregnant.  Are overweight.  Have diabetes.  Have hepatitis.  Have a high triglyceride level.  What are the signs or symptoms? Fatty liver often does not cause any symptoms. In cases where symptoms develop, they can include:  Fatigue.  Weakness.  Weight loss.  Confusion.  Abdominal pain.  Yellowing of your skin and the white parts of your eyes  (jaundice).  Nausea and vomiting.  How is this diagnosed? Fatty liver may be diagnosed by:  Physical exam and medical history.  Blood tests.  Imaging tests, such as an ultrasound, CT scan, or MRI.  Liver biopsy. A small sample of liver tissue is removed using a needle. The sample is then looked at under a microscope.  How is this treated? Fatty liver is often caused by other health conditions. Treatment for fatty liver may involve medicines and lifestyle changes to manage conditions such as:  Alcoholism.  High cholesterol.  Diabetes.  Being overweight or obese.  Follow these instructions at home:  Eat a healthy diet as directed by your health care provider.  Exercise regularly. This can help you lose weight and control your cholesterol and diabetes. Talk to your health care provider about an exercise plan and which activities are best for you.  Do not drink alcohol.  Take medicines only as directed by your health care provider. Contact a health care provider if: You have difficulty controlling your:  Blood sugar.  Cholesterol.  Alcohol consumption.  Get help right away if:  You have abdominal pain.  You have jaundice.  You have nausea and vomiting. This information is not intended to replace advice given to you by your health care provider. Make sure you discuss any questions you have with your health care provider. Document Released: 09/29/2005 Document Revised: 01/20/2016 Document Reviewed: 12/24/2013 Elsevier Interactive Patient Education  [employer]2018 [employer]

## 2017-01-31 DIAGNOSIS — R1084 Generalized abdominal pain: Secondary | ICD-10-CM | POA: Diagnosis not present

## 2017-01-31 DIAGNOSIS — K921 Melena: Secondary | ICD-10-CM | POA: Diagnosis not present

## 2017-01-31 DIAGNOSIS — R197 Diarrhea, unspecified: Secondary | ICD-10-CM | POA: Diagnosis not present

## 2017-02-02 ENCOUNTER — Other Ambulatory Visit: Payer: Self-pay | Admitting: Adult Health

## 2017-02-05 LAB — GASTROINTESTINAL PATHOGEN PANEL PCR
C. difficile Tox A/B, PCR: NOT DETECTED
CRYPTOSPORIDIUM, PCR: NOT DETECTED
Campylobacter, PCR: NOT DETECTED
E coli (ETEC) LT/ST PCR: NOT DETECTED
E coli (STEC) stx1/stx2, PCR: NOT DETECTED
E coli 0157, PCR: NOT DETECTED
GIARDIA LAMBLIA, PCR: NOT DETECTED
NOROVIRUS, PCR: NOT DETECTED
ROTAVIRUS, PCR: NOT DETECTED
SHIGELLA, PCR: NOT DETECTED
Salmonella, PCR: NOT DETECTED

## 2017-02-07 DIAGNOSIS — J449 Chronic obstructive pulmonary disease, unspecified: Secondary | ICD-10-CM | POA: Diagnosis not present

## 2017-02-12 ENCOUNTER — Ambulatory Visit: Payer: BLUE CROSS/BLUE SHIELD | Admitting: Adult Health

## 2017-02-19 ENCOUNTER — Ambulatory Visit (INDEPENDENT_AMBULATORY_CARE_PROVIDER_SITE_OTHER): Payer: BLUE CROSS/BLUE SHIELD | Admitting: Adult Health

## 2017-02-19 ENCOUNTER — Encounter: Payer: Self-pay | Admitting: Adult Health

## 2017-02-19 VITALS — BP 130/80 | Wt 213.0 lb

## 2017-02-19 DIAGNOSIS — B379 Candidiasis, unspecified: Secondary | ICD-10-CM

## 2017-02-19 DIAGNOSIS — J449 Chronic obstructive pulmonary disease, unspecified: Secondary | ICD-10-CM | POA: Diagnosis not present

## 2017-02-19 DIAGNOSIS — N3946 Mixed incontinence: Secondary | ICD-10-CM | POA: Diagnosis not present

## 2017-02-19 LAB — POCT WET PREP (WET MOUNT)

## 2017-02-19 MED ORDER — FLUCONAZOLE 150 MG PO TABS: ORAL_TABLET | ORAL | 1 refills | Status: DC

## 2017-02-19 NOTE — Addendum Note (Signed)
Addended by: Cyril MourningGRIFFIN, Asiah Befort A on: 02/19/2017 03:41 PM   Modules accepted: Orders

## 2017-02-19 NOTE — Progress Notes (Signed)
Subjective:     Patient ID: Jessica ReichmannLisa M Mendez, female   DOB: [birth year], 51 y.o.   MRN: [id number]  HPI Jessica Mendez is a 51 year old white female back in follow up of taking detrol for UI, has not taken regular to diarrhea and pain.Has itching and burning in vagina, after taking 2 antibiotics. She says she had free fluid in abdomen and is seeing GI and having procedure 03/08/17.   Review of Systems Vaginal itching and burning UI Reviewed past medical,surgical, social and family history. Reviewed medications and allergies.     Objective:   Physical Exam BP 130/80 (BP Location: Left Arm, Patient Position: Sitting, Cuff Size: Large)   Wt 213 lb (96.6 kg)   BMI 35.45 kg/m  Skin warm and dry.Pelvic: external genitalia is normal in appearance no lesions,but red, vagina: red with white discharge without odor,urethra has no lesions or masses noted, cervix and uterus absent. adnexa: no masses or tenderness noted. Bladder is non tender and no masses felt. Wet prep: + for yeast.Painted with gentian violet.     Assessment:     1. Mixed stress and urge urinary incontinence   2. Yeast infection       Plan:     Rx diflucan 150 mg #2 take 1 now and repeat in 3 days if needed with 1 refill Take detrol daily Follow up in 3 weeks, if not better will try myrbetriq

## 2017-02-27 DIAGNOSIS — J449 Chronic obstructive pulmonary disease, unspecified: Secondary | ICD-10-CM | POA: Diagnosis not present

## 2017-03-05 DIAGNOSIS — J449 Chronic obstructive pulmonary disease, unspecified: Secondary | ICD-10-CM | POA: Diagnosis not present

## 2017-03-06 ENCOUNTER — Telehealth: Payer: Self-pay

## 2017-03-06 NOTE — Telephone Encounter (Signed)
I spoke with the patient and made her aware that there are no guarantees until a claim is submitted.   Patient thanked me for my help and ended the call.

## 2017-03-06 NOTE — Telephone Encounter (Signed)
Pt called office and is concerned about her upcoming TCS/EGD that is scheduled for 03/08/17 with [facility]. She thought it would be a screening. EG seen her in the office 01/29/17 and is out of the office this week. Routing to CM for advice. Pt can be reached at [phone].

## 2017-03-07 NOTE — OR Nursing (Signed)
Patient scheduled for an EGD and TCS with Dr. Jena Gaussourk tomorrow with moderate sedation. Wynne DustEric Gill, NP H&P states patient needs preservative free medications for sedation. Notified Lum KeasMike Hayes, pharmacist who stated Demerol is not preservative-free, Fentanyl is preservative-free and Versed is not preservative-free. Dr Jena Gaussourk notified of the above and asked about patient having procedure with propofol. Notifed Lori, pharmacist who said propofol has preservative EDTA in it. Notified Dr. Jayme CloudGonzalez of the above and inquired about patient having procedures with propofol. Dr. Jayme CloudGonzalez stated he was not comfortable as all of the medications he would administer would have some type of preservative in them. Dr. Jena Gaussourk notified again of the above and stated procedures would be cancelled for tomorrow and he would have [facility] contact the patient. Spoke with Durward Mallardamille, who stated she contacted the patient and is [facility].

## 2017-03-07 NOTE — Telephone Encounter (Signed)
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  [facility].  I spoke with the patient and made her aware that we would need to refer [facility] to have her [facility] procedures.  [facility].  Ginger, please call [facility] and make them aware of the patient's severe allergy to sedation medication with Preservatives and make sure we schedule her procedure(TCS/EGD).

## 2017-03-08 ENCOUNTER — Ambulatory Visit (HOSPITAL_COMMUNITY)
Admission: RE | Admit: 2017-03-08 | Payer: BLUE CROSS/BLUE SHIELD | Source: Ambulatory Visit | Admitting: Internal Medicine

## 2017-03-08 ENCOUNTER — Encounter (HOSPITAL_COMMUNITY): Admission: RE | Payer: Self-pay | Source: Ambulatory Visit

## 2017-03-08 SURGERY — COLONOSCOPY
Anesthesia: Moderate Sedation

## 2017-03-08 NOTE — Telephone Encounter (Signed)
Jessica Mendez is tried to find out if they can get the medication with NO PRESERVATIVES. They will call us back to let us know.

## 2017-03-08 NOTE — Telephone Encounter (Addendum)
[facility] and the triage nurse(Christine) is going to call her and get her set up. I also expressed her allergy and they made sure it ws in her chart.

## 2017-03-08 NOTE — Telephone Encounter (Signed)
I spoke with the patient and made her aware that [facility] are [facility] to see if they can order the sedation medication without preservatives.  Patient stated that she received a call from [facility] stating they are still working on seeing if they are able to order medication without preservatives.

## 2017-03-12 ENCOUNTER — Ambulatory Visit: Payer: BLUE CROSS/BLUE SHIELD | Admitting: Adult Health

## 2017-03-12 DIAGNOSIS — Z01818 Encounter for other preprocedural examination: Secondary | ICD-10-CM | POA: Diagnosis not present

## 2017-03-12 DIAGNOSIS — Z01812 Encounter for preprocedural laboratory examination: Secondary | ICD-10-CM | POA: Diagnosis not present

## 2017-03-12 DIAGNOSIS — R0683 Snoring: Secondary | ICD-10-CM | POA: Diagnosis not present

## 2017-03-12 DIAGNOSIS — Z9189 Other specified personal risk factors, not elsewhere classified: Secondary | ICD-10-CM | POA: Diagnosis not present

## 2017-03-12 DIAGNOSIS — E669 Obesity, unspecified: Secondary | ICD-10-CM | POA: Diagnosis not present

## 2017-03-12 DIAGNOSIS — J45909 Unspecified asthma, uncomplicated: Secondary | ICD-10-CM | POA: Diagnosis not present

## 2017-03-12 DIAGNOSIS — I10 Essential (primary) hypertension: Secondary | ICD-10-CM | POA: Diagnosis not present

## 2017-03-13 DIAGNOSIS — K529 Noninfective gastroenteritis and colitis, unspecified: Secondary | ICD-10-CM | POA: Diagnosis not present

## 2017-03-13 DIAGNOSIS — K3189 Other diseases of stomach and duodenum: Secondary | ICD-10-CM | POA: Diagnosis not present

## 2017-03-13 DIAGNOSIS — L539 Erythematous condition, unspecified: Secondary | ICD-10-CM | POA: Diagnosis not present

## 2017-03-13 DIAGNOSIS — R197 Diarrhea, unspecified: Secondary | ICD-10-CM | POA: Diagnosis not present

## 2017-03-13 DIAGNOSIS — R1084 Generalized abdominal pain: Secondary | ICD-10-CM | POA: Diagnosis not present

## 2017-03-14 DIAGNOSIS — J449 Chronic obstructive pulmonary disease, unspecified: Secondary | ICD-10-CM | POA: Diagnosis not present

## 2017-03-14 NOTE — Telephone Encounter (Signed)
[facility]

## 2017-03-19 DIAGNOSIS — R938 Abnormal findings on diagnostic imaging of other specified body structures: Secondary | ICD-10-CM | POA: Diagnosis not present

## 2017-03-19 DIAGNOSIS — Z720 Tobacco use: Secondary | ICD-10-CM | POA: Diagnosis not present

## 2017-03-19 DIAGNOSIS — R911 Solitary pulmonary nodule: Secondary | ICD-10-CM | POA: Diagnosis not present

## 2017-03-19 DIAGNOSIS — J449 Chronic obstructive pulmonary disease, unspecified: Secondary | ICD-10-CM | POA: Diagnosis not present

## 2017-03-20 ENCOUNTER — Encounter: Payer: Self-pay | Admitting: Adult Health

## 2017-03-20 ENCOUNTER — Ambulatory Visit (INDEPENDENT_AMBULATORY_CARE_PROVIDER_SITE_OTHER): Payer: BLUE CROSS/BLUE SHIELD | Admitting: Adult Health

## 2017-03-20 VITALS — BP 122/70 | HR 84 | Ht 65.0 in | Wt 216.0 lb

## 2017-03-20 DIAGNOSIS — N3946 Mixed incontinence: Secondary | ICD-10-CM

## 2017-03-20 DIAGNOSIS — L298 Other pruritus: Secondary | ICD-10-CM | POA: Diagnosis not present

## 2017-03-20 DIAGNOSIS — N898 Other specified noninflammatory disorders of vagina: Secondary | ICD-10-CM

## 2017-03-20 MED ORDER — MIRABEGRON ER 25 MG PO TB24: 25.0000 mg | ORAL_TABLET | Freq: Every day | ORAL | 3 refills | Status: DC

## 2017-03-20 MED ORDER — FLUCONAZOLE 100 MG PO TABS: ORAL_TABLET | ORAL | 0 refills | Status: DC

## 2017-03-20 NOTE — Progress Notes (Signed)
Subjective:     Patient ID: Jessica ReichmannLisa M Mendez, female   DOB: 12/16/1965, 51 y.o.   MRN: [id number]  HPI Jessica StanleyLisa is a 51 year old white female back in follow up of taking detrol , and still has UI, has failed on oxybutynin too.Has vaginal itching too, has taken diflucan x 2 .    Review of Systems Still has UI Vaginal itching at times  Reviewed past medical,surgical, social and family history. Reviewed medications and allergies.     Objective:   Physical Exam BP 122/70 (BP Location: Left Arm, Patient Position: Sitting, Cuff Size: Large)   Pulse 84   Ht 5\' 5"  (1.651 m)   Wt 216 lb (98 kg)   BMI 35.94 kg/m    Skin warm and dry.Pelvic: external genitalia is normal in appearance no lesions,but has moisture changes, vagina: red,urethra has no lesions or masses noted, cervix and uterus are absent, adnexa: no masses or tenderness noted. Bladder is non tender and no masses felt. Painted labia and vagina with gentian violet.  Assessment:     1. Mixed stress and urge urinary incontinence   2. Vaginal itching       Plan:     Rx myrbetriq 25 mg #30 take 1 daily with 3 refills, has discount card Rx diflucan 100 mg #10 take 1 daily F/U in 4 weeks, if not better refer to urology

## 2017-03-21 ENCOUNTER — Telehealth: Payer: Self-pay | Admitting: Internal Medicine

## 2017-03-21 NOTE — Telephone Encounter (Signed)
[phone]  PATIENT CALLED WANTING TO KNOW IF HER RESULTS FROM [facility] WERE HERE

## 2017-03-21 NOTE — Telephone Encounter (Signed)
Jessica Mendez, have you gotten anything on her?

## 2017-03-21 NOTE — Telephone Encounter (Signed)
I've looked in Care Everywhere and don't see an actual report. Darl PikesSusan, can we request the reports and surgical path?

## 2017-03-22 NOTE — Telephone Encounter (Signed)
Requested records from [facility] °

## 2017-03-22 NOTE — Telephone Encounter (Signed)
I have printed the [facility] from [facility] and put them on EG desk.

## 2017-03-23 NOTE — Telephone Encounter (Signed)
Please tell the patient I reviewed her results. The colonoscopy showed some irritation of the lining of her rectum (possible this is from the prep and frequent bowel movements associated with it). They took multiple biopsies, which showed no significant abnormality and nothing that would be causing her diarrhea.  The EGD showed essentially normal but they took multiple biopsies of the beginning of her small intestines. The pathology on these biopsies showed some increased white blood cells. This is not specific for any particular abnormality but can be found in gluten sensitivity, reflux-type disorders, effects from medications, etc.  I recommend trying 2 weeks of gluten free diet and see if this makes a difference in her symptoms. On follow-up we can discuss if it did or not and other possible workup/treatment.

## 2017-03-26 DIAGNOSIS — R938 Abnormal findings on diagnostic imaging of other specified body structures: Secondary | ICD-10-CM | POA: Diagnosis not present

## 2017-03-26 NOTE — Telephone Encounter (Signed)
Pt is aware. She will try a gluten free diet and will call back in 2 weeks and let me know how she is doing.

## 2017-04-02 DIAGNOSIS — J449 Chronic obstructive pulmonary disease, unspecified: Secondary | ICD-10-CM | POA: Diagnosis not present

## 2017-04-14 DIAGNOSIS — J449 Chronic obstructive pulmonary disease, unspecified: Secondary | ICD-10-CM | POA: Diagnosis not present

## 2017-04-16 ENCOUNTER — Ambulatory Visit (INDEPENDENT_AMBULATORY_CARE_PROVIDER_SITE_OTHER): Payer: BLUE CROSS/BLUE SHIELD | Admitting: Adult Health

## 2017-04-16 ENCOUNTER — Encounter: Payer: Self-pay | Admitting: Adult Health

## 2017-04-16 VITALS — BP 136/80 | HR 78 | Ht 65.5 in | Wt 214.0 lb

## 2017-04-16 DIAGNOSIS — N3946 Mixed incontinence: Secondary | ICD-10-CM | POA: Diagnosis not present

## 2017-04-16 DIAGNOSIS — J449 Chronic obstructive pulmonary disease, unspecified: Secondary | ICD-10-CM | POA: Diagnosis not present

## 2017-04-16 MED ORDER — MIRABEGRON ER 50 MG PO TB24: 50.0000 mg | ORAL_TABLET | Freq: Every day | ORAL | 6 refills | Status: DC

## 2017-04-16 NOTE — Progress Notes (Signed)
Subjective:     Patient ID: Jessica Mendez, female   DOB: Aug 21, 1966, 51 y.o.   MRN: [id number]  HPI Jessica Mendez is a 51 year old white female, back in follow up of trying myrbetriq 2 5 mg daily and is a little better, but wants to be a lot better.   Review of Systems +SUI +urge incontinence Reviewed past medical,surgical, social and family history. Reviewed medications and allergies.     Objective:   Physical Exam BP 136/80 (BP Location: Left Arm, Patient Position: Sitting, Cuff Size: Large)   Pulse 78   Ht 5' 5.5" (1.664 m)   Wt 214 lb (97.1 kg)   BMI 35.07 kg/m   Talk only, will increase myrbetriq to 50 mg daily and if not a lot better in 4 weeks will refer to urologist, and she agrees.  Assessment:     1. Mixed stress and urge urinary incontinence       Plan:     Increase myrbetriq to 50 mg daily, #30 take 1 daily with 6 refills Follow up in 4 weeks, if not a lot better will refer to urology

## 2017-04-25 DIAGNOSIS — J449 Chronic obstructive pulmonary disease, unspecified: Secondary | ICD-10-CM | POA: Diagnosis not present

## 2017-05-01 ENCOUNTER — Ambulatory Visit: Payer: BLUE CROSS/BLUE SHIELD | Admitting: Nurse Practitioner

## 2017-05-01 DIAGNOSIS — J449 Chronic obstructive pulmonary disease, unspecified: Secondary | ICD-10-CM | POA: Diagnosis not present

## 2017-05-14 ENCOUNTER — Ambulatory Visit: Payer: BLUE CROSS/BLUE SHIELD | Admitting: Adult Health

## 2017-05-14 ENCOUNTER — Telehealth: Payer: Self-pay | Admitting: Adult Health

## 2017-05-14 NOTE — Telephone Encounter (Signed)
Left message that I am glad meds working for her

## 2017-05-15 DIAGNOSIS — J449 Chronic obstructive pulmonary disease, unspecified: Secondary | ICD-10-CM | POA: Diagnosis not present

## 2017-05-31 DIAGNOSIS — J449 Chronic obstructive pulmonary disease, unspecified: Secondary | ICD-10-CM | POA: Diagnosis not present

## 2017-06-04 DIAGNOSIS — J449 Chronic obstructive pulmonary disease, unspecified: Secondary | ICD-10-CM | POA: Diagnosis not present

## 2017-06-12 DIAGNOSIS — J449 Chronic obstructive pulmonary disease, unspecified: Secondary | ICD-10-CM | POA: Diagnosis not present

## 2017-06-18 ENCOUNTER — Ambulatory Visit: Payer: BLUE CROSS/BLUE SHIELD | Admitting: Nurse Practitioner

## 2017-06-18 DIAGNOSIS — J449 Chronic obstructive pulmonary disease, unspecified: Secondary | ICD-10-CM | POA: Diagnosis not present

## 2017-06-27 DIAGNOSIS — J449 Chronic obstructive pulmonary disease, unspecified: Secondary | ICD-10-CM | POA: Diagnosis not present

## 2017-07-02 DIAGNOSIS — J449 Chronic obstructive pulmonary disease, unspecified: Secondary | ICD-10-CM | POA: Diagnosis not present

## 2017-07-02 DIAGNOSIS — Z23 Encounter for immunization: Secondary | ICD-10-CM | POA: Diagnosis not present

## 2017-07-05 ENCOUNTER — Other Ambulatory Visit: Payer: Self-pay | Admitting: Obstetrics and Gynecology

## 2017-07-05 DIAGNOSIS — Z1231 Encounter for screening mammogram for malignant neoplasm of breast: Secondary | ICD-10-CM

## 2017-07-11 DIAGNOSIS — J449 Chronic obstructive pulmonary disease, unspecified: Secondary | ICD-10-CM | POA: Diagnosis not present

## 2017-07-16 DIAGNOSIS — J449 Chronic obstructive pulmonary disease, unspecified: Secondary | ICD-10-CM | POA: Diagnosis not present

## 2017-07-16 DIAGNOSIS — Z23 Encounter for immunization: Secondary | ICD-10-CM | POA: Diagnosis not present

## 2017-07-23 ENCOUNTER — Ambulatory Visit (HOSPITAL_COMMUNITY)
Admission: RE | Admit: 2017-07-23 | Discharge: 2017-07-23 | Disposition: A | Discharge status: Home / Self Care | Payer: BLUE CROSS/BLUE SHIELD | Source: Ambulatory Visit | Attending: Obstetrics and Gynecology | Admitting: Obstetrics and Gynecology

## 2017-07-23 ENCOUNTER — Encounter (HOSPITAL_COMMUNITY): Payer: Self-pay

## 2017-07-23 DIAGNOSIS — Z1231 Encounter for screening mammogram for malignant neoplasm of breast: Secondary | ICD-10-CM | POA: Insufficient documentation

## 2017-07-24 DIAGNOSIS — J449 Chronic obstructive pulmonary disease, unspecified: Secondary | ICD-10-CM | POA: Diagnosis not present

## 2017-08-11 DIAGNOSIS — J449 Chronic obstructive pulmonary disease, unspecified: Secondary | ICD-10-CM | POA: Diagnosis not present

## 2017-09-13 ENCOUNTER — Telehealth: Payer: Self-pay | Admitting: *Deleted

## 2017-09-17 NOTE — Telephone Encounter (Signed)
Sent mychart message to patient inquiring about what rx she is referring to.

## 2017-09-24 DIAGNOSIS — R911 Solitary pulmonary nodule: Secondary | ICD-10-CM | POA: Diagnosis not present

## 2017-09-24 DIAGNOSIS — J449 Chronic obstructive pulmonary disease, unspecified: Secondary | ICD-10-CM | POA: Diagnosis not present

## 2017-09-24 DIAGNOSIS — E669 Obesity, unspecified: Secondary | ICD-10-CM | POA: Diagnosis not present

## 2017-09-24 DIAGNOSIS — R0602 Shortness of breath: Secondary | ICD-10-CM | POA: Diagnosis not present

## 2017-09-25 DIAGNOSIS — J449 Chronic obstructive pulmonary disease, unspecified: Secondary | ICD-10-CM | POA: Diagnosis not present

## 2017-09-26 ENCOUNTER — Telehealth: Payer: Self-pay | Admitting: *Deleted

## 2017-09-26 NOTE — Telephone Encounter (Signed)
Attempted to call patient back and phone went to voicemail, unable to leave message.

## 2017-09-27 NOTE — Telephone Encounter (Signed)
Patient requesting PA for Myrbetriq

## 2017-10-02 ENCOUNTER — Telehealth: Payer: Self-pay | Admitting: *Deleted

## 2017-10-02 NOTE — Telephone Encounter (Signed)
Myrbetriq approved.

## 2017-10-04 DIAGNOSIS — J449 Chronic obstructive pulmonary disease, unspecified: Secondary | ICD-10-CM | POA: Diagnosis not present

## 2017-10-08 DIAGNOSIS — J449 Chronic obstructive pulmonary disease, unspecified: Secondary | ICD-10-CM | POA: Diagnosis not present

## 2017-10-18 DIAGNOSIS — J449 Chronic obstructive pulmonary disease, unspecified: Secondary | ICD-10-CM | POA: Diagnosis not present

## 2017-10-22 DIAGNOSIS — J449 Chronic obstructive pulmonary disease, unspecified: Secondary | ICD-10-CM | POA: Diagnosis not present

## 2017-10-31 DIAGNOSIS — J449 Chronic obstructive pulmonary disease, unspecified: Secondary | ICD-10-CM | POA: Diagnosis not present

## 2017-11-05 DIAGNOSIS — J449 Chronic obstructive pulmonary disease, unspecified: Secondary | ICD-10-CM | POA: Diagnosis not present

## 2017-11-14 DIAGNOSIS — J449 Chronic obstructive pulmonary disease, unspecified: Secondary | ICD-10-CM | POA: Diagnosis not present

## 2017-11-19 DIAGNOSIS — J449 Chronic obstructive pulmonary disease, unspecified: Secondary | ICD-10-CM | POA: Diagnosis not present

## 2017-11-29 DIAGNOSIS — J449 Chronic obstructive pulmonary disease, unspecified: Secondary | ICD-10-CM | POA: Diagnosis not present

## 2017-12-03 DIAGNOSIS — J449 Chronic obstructive pulmonary disease, unspecified: Secondary | ICD-10-CM | POA: Diagnosis not present

## 2017-12-12 DIAGNOSIS — J449 Chronic obstructive pulmonary disease, unspecified: Secondary | ICD-10-CM | POA: Diagnosis not present

## 2017-12-13 ENCOUNTER — Other Ambulatory Visit: Payer: Self-pay | Admitting: Adult Health

## 2017-12-17 DIAGNOSIS — J449 Chronic obstructive pulmonary disease, unspecified: Secondary | ICD-10-CM | POA: Diagnosis not present

## 2017-12-27 DIAGNOSIS — J449 Chronic obstructive pulmonary disease, unspecified: Secondary | ICD-10-CM | POA: Diagnosis not present

## 2018-01-05 ENCOUNTER — Other Ambulatory Visit: Payer: Self-pay | Admitting: Adult Health

## 2018-01-08 ENCOUNTER — Other Ambulatory Visit: Payer: Self-pay | Admitting: Adult Health

## 2018-01-10 DIAGNOSIS — J449 Chronic obstructive pulmonary disease, unspecified: Secondary | ICD-10-CM | POA: Diagnosis not present

## 2018-01-23 DIAGNOSIS — H524 Presbyopia: Secondary | ICD-10-CM | POA: Diagnosis not present

## 2018-01-24 DIAGNOSIS — J449 Chronic obstructive pulmonary disease, unspecified: Secondary | ICD-10-CM | POA: Diagnosis not present

## 2018-02-06 DIAGNOSIS — J449 Chronic obstructive pulmonary disease, unspecified: Secondary | ICD-10-CM | POA: Diagnosis not present

## 2018-02-11 DIAGNOSIS — R911 Solitary pulmonary nodule: Secondary | ICD-10-CM | POA: Diagnosis not present

## 2018-02-20 ENCOUNTER — Other Ambulatory Visit: Payer: Self-pay | Admitting: Adult Health

## 2018-03-07 DIAGNOSIS — J449 Chronic obstructive pulmonary disease, unspecified: Secondary | ICD-10-CM | POA: Diagnosis not present

## 2018-03-20 DIAGNOSIS — J449 Chronic obstructive pulmonary disease, unspecified: Secondary | ICD-10-CM | POA: Diagnosis not present

## 2018-04-03 DIAGNOSIS — J449 Chronic obstructive pulmonary disease, unspecified: Secondary | ICD-10-CM | POA: Diagnosis not present

## 2018-04-10 DIAGNOSIS — F17200 Nicotine dependence, unspecified, uncomplicated: Secondary | ICD-10-CM | POA: Diagnosis not present

## 2018-04-10 DIAGNOSIS — J069 Acute upper respiratory infection, unspecified: Secondary | ICD-10-CM | POA: Diagnosis not present

## 2018-04-10 DIAGNOSIS — J029 Acute pharyngitis, unspecified: Secondary | ICD-10-CM | POA: Diagnosis not present

## 2018-04-18 DIAGNOSIS — J449 Chronic obstructive pulmonary disease, unspecified: Secondary | ICD-10-CM | POA: Diagnosis not present

## 2018-04-22 DIAGNOSIS — J309 Allergic rhinitis, unspecified: Secondary | ICD-10-CM | POA: Diagnosis not present

## 2018-04-22 DIAGNOSIS — J449 Chronic obstructive pulmonary disease, unspecified: Secondary | ICD-10-CM | POA: Diagnosis not present

## 2018-04-22 DIAGNOSIS — J069 Acute upper respiratory infection, unspecified: Secondary | ICD-10-CM | POA: Diagnosis not present

## 2018-04-22 DIAGNOSIS — R0602 Shortness of breath: Secondary | ICD-10-CM | POA: Diagnosis not present

## 2018-05-03 DIAGNOSIS — J449 Chronic obstructive pulmonary disease, unspecified: Secondary | ICD-10-CM | POA: Diagnosis not present

## 2018-05-14 DIAGNOSIS — M62838 Other muscle spasm: Secondary | ICD-10-CM | POA: Diagnosis not present

## 2018-05-14 DIAGNOSIS — M5412 Radiculopathy, cervical region: Secondary | ICD-10-CM | POA: Diagnosis not present

## 2018-05-16 DIAGNOSIS — J449 Chronic obstructive pulmonary disease, unspecified: Secondary | ICD-10-CM | POA: Diagnosis not present

## 2018-05-28 DIAGNOSIS — G5603 Carpal tunnel syndrome, bilateral upper limbs: Secondary | ICD-10-CM | POA: Diagnosis not present

## 2018-06-07 DIAGNOSIS — J449 Chronic obstructive pulmonary disease, unspecified: Secondary | ICD-10-CM | POA: Diagnosis not present

## 2018-06-13 ENCOUNTER — Other Ambulatory Visit: Payer: Self-pay | Admitting: Adult Health

## 2018-06-18 DIAGNOSIS — J449 Chronic obstructive pulmonary disease, unspecified: Secondary | ICD-10-CM | POA: Diagnosis not present

## 2018-07-04 DIAGNOSIS — J449 Chronic obstructive pulmonary disease, unspecified: Secondary | ICD-10-CM | POA: Diagnosis not present

## 2018-07-09 DIAGNOSIS — J449 Chronic obstructive pulmonary disease, unspecified: Secondary | ICD-10-CM | POA: Diagnosis not present

## 2018-07-12 ENCOUNTER — Other Ambulatory Visit: Payer: Self-pay | Admitting: Adult Health

## 2018-07-18 DIAGNOSIS — J449 Chronic obstructive pulmonary disease, unspecified: Secondary | ICD-10-CM | POA: Diagnosis not present

## 2018-07-29 DIAGNOSIS — J449 Chronic obstructive pulmonary disease, unspecified: Secondary | ICD-10-CM | POA: Diagnosis not present

## 2018-08-06 DIAGNOSIS — J449 Chronic obstructive pulmonary disease, unspecified: Secondary | ICD-10-CM | POA: Diagnosis not present

## 2018-08-12 DIAGNOSIS — J449 Chronic obstructive pulmonary disease, unspecified: Secondary | ICD-10-CM | POA: Diagnosis not present

## 2018-08-14 ENCOUNTER — Other Ambulatory Visit: Payer: Self-pay | Admitting: Obstetrics and Gynecology

## 2018-08-14 DIAGNOSIS — Z1231 Encounter for screening mammogram for malignant neoplasm of breast: Secondary | ICD-10-CM

## 2018-08-19 DIAGNOSIS — J449 Chronic obstructive pulmonary disease, unspecified: Secondary | ICD-10-CM | POA: Diagnosis not present

## 2018-08-26 DIAGNOSIS — J449 Chronic obstructive pulmonary disease, unspecified: Secondary | ICD-10-CM | POA: Diagnosis not present

## 2018-09-05 DIAGNOSIS — J449 Chronic obstructive pulmonary disease, unspecified: Secondary | ICD-10-CM | POA: Diagnosis not present

## 2018-09-09 ENCOUNTER — Encounter (HOSPITAL_COMMUNITY): Payer: Self-pay

## 2018-09-09 ENCOUNTER — Ambulatory Visit (HOSPITAL_COMMUNITY)
Admission: RE | Admit: 2018-09-09 | Discharge: 2018-09-09 | Disposition: A | Discharge status: Home / Self Care | Payer: BLUE CROSS/BLUE SHIELD | Source: Ambulatory Visit | Attending: Obstetrics and Gynecology | Admitting: Obstetrics and Gynecology

## 2018-09-09 DIAGNOSIS — J449 Chronic obstructive pulmonary disease, unspecified: Secondary | ICD-10-CM | POA: Diagnosis not present

## 2018-09-09 DIAGNOSIS — Z1231 Encounter for screening mammogram for malignant neoplasm of breast: Secondary | ICD-10-CM | POA: Diagnosis not present

## 2018-09-17 ENCOUNTER — Other Ambulatory Visit: Payer: Self-pay | Admitting: Women's Health

## 2018-09-17 ENCOUNTER — Other Ambulatory Visit: Payer: Self-pay | Admitting: Adult Health

## 2018-09-23 DIAGNOSIS — J449 Chronic obstructive pulmonary disease, unspecified: Secondary | ICD-10-CM | POA: Diagnosis not present

## 2018-10-01 DIAGNOSIS — J449 Chronic obstructive pulmonary disease, unspecified: Secondary | ICD-10-CM | POA: Diagnosis not present

## 2018-10-07 DIAGNOSIS — J449 Chronic obstructive pulmonary disease, unspecified: Secondary | ICD-10-CM | POA: Diagnosis not present

## 2018-10-21 DIAGNOSIS — J449 Chronic obstructive pulmonary disease, unspecified: Secondary | ICD-10-CM | POA: Diagnosis not present

## 2018-10-28 DIAGNOSIS — J449 Chronic obstructive pulmonary disease, unspecified: Secondary | ICD-10-CM | POA: Diagnosis not present

## 2018-11-01 DIAGNOSIS — M5412 Radiculopathy, cervical region: Secondary | ICD-10-CM | POA: Diagnosis not present

## 2018-11-01 DIAGNOSIS — M62838 Other muscle spasm: Secondary | ICD-10-CM | POA: Diagnosis not present

## 2018-11-04 DIAGNOSIS — J449 Chronic obstructive pulmonary disease, unspecified: Secondary | ICD-10-CM | POA: Diagnosis not present

## 2018-11-18 DIAGNOSIS — J449 Chronic obstructive pulmonary disease, unspecified: Secondary | ICD-10-CM | POA: Diagnosis not present

## 2018-11-19 DIAGNOSIS — J449 Chronic obstructive pulmonary disease, unspecified: Secondary | ICD-10-CM | POA: Diagnosis not present

## 2018-11-26 ENCOUNTER — Other Ambulatory Visit: Payer: Self-pay | Admitting: Adult Health

## 2018-12-02 DIAGNOSIS — M5412 Radiculopathy, cervical region: Secondary | ICD-10-CM | POA: Diagnosis not present

## 2018-12-16 DIAGNOSIS — M5412 Radiculopathy, cervical region: Secondary | ICD-10-CM | POA: Diagnosis not present

## 2018-12-18 DIAGNOSIS — J449 Chronic obstructive pulmonary disease, unspecified: Secondary | ICD-10-CM | POA: Diagnosis not present

## 2018-12-23 DIAGNOSIS — J449 Chronic obstructive pulmonary disease, unspecified: Secondary | ICD-10-CM | POA: Diagnosis not present

## 2019-01-06 DIAGNOSIS — J449 Chronic obstructive pulmonary disease, unspecified: Secondary | ICD-10-CM | POA: Diagnosis not present

## 2019-01-22 DIAGNOSIS — J449 Chronic obstructive pulmonary disease, unspecified: Secondary | ICD-10-CM | POA: Diagnosis not present

## 2020-06-06 IMAGING — MG DIGITAL SCREENING BILATERAL MAMMOGRAM WITH TOMO AND CAD
8 series · 8 of 24 positions shown · non-contrast
Comparison: Previous exam(s).

CLINICAL DATA: Screening.

EXAM:
DIGITAL SCREENING BILATERAL MAMMOGRAM WITH TOMO AND CAD

[L CC synth-2D]
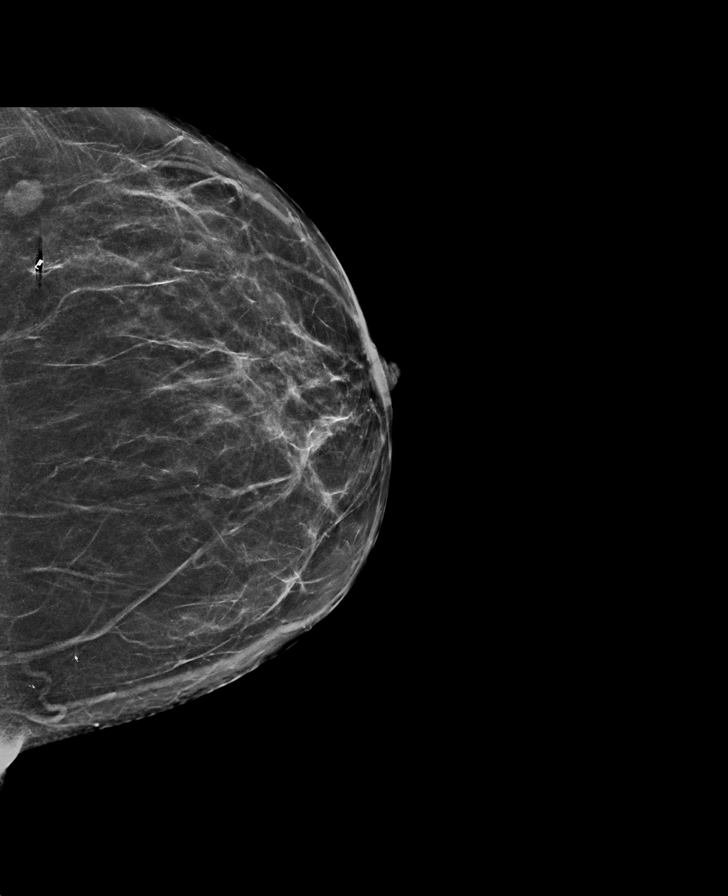

[R MLO synth-2D]
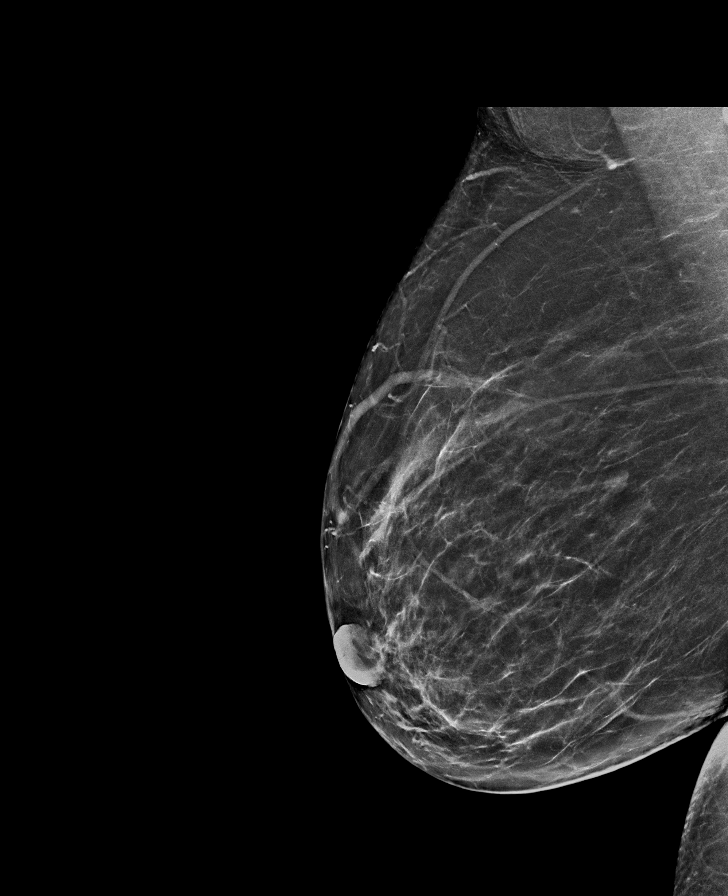

[R CC synth-2D]
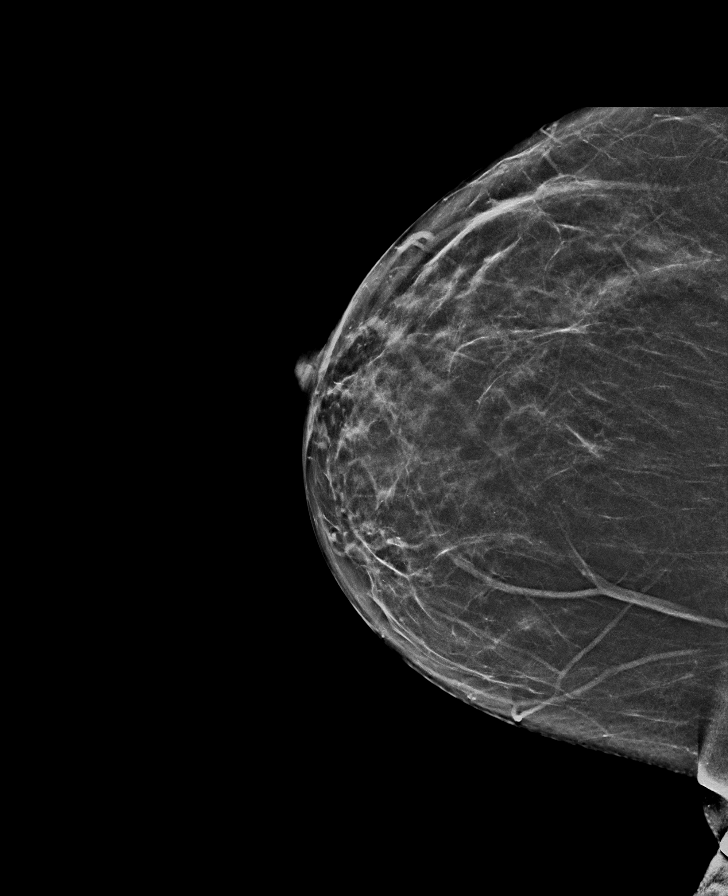

[L MLO synth-2D]
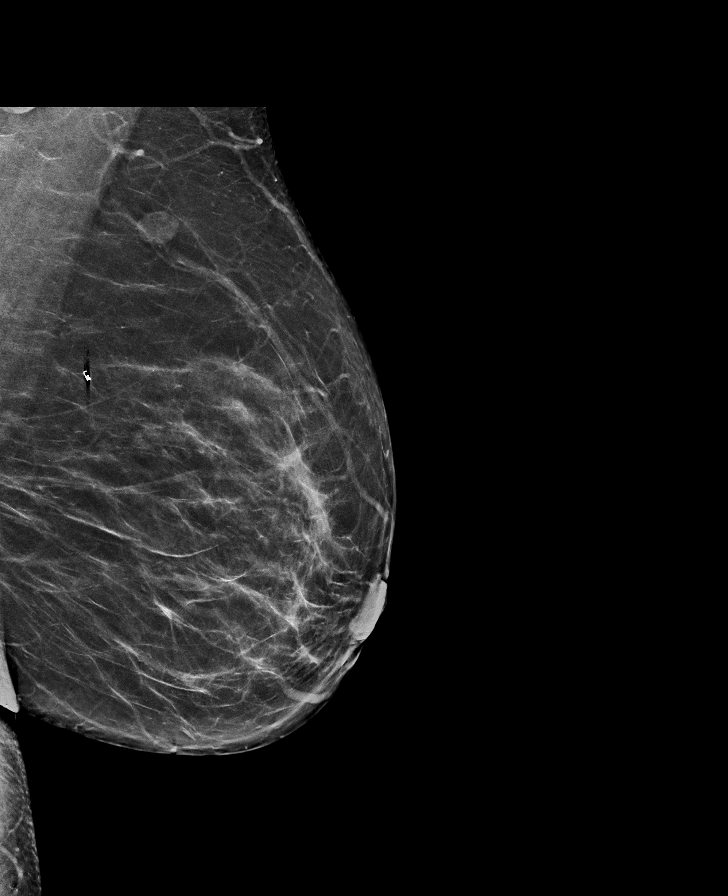

[L MLO tomo · tomo slice 36/71.0]
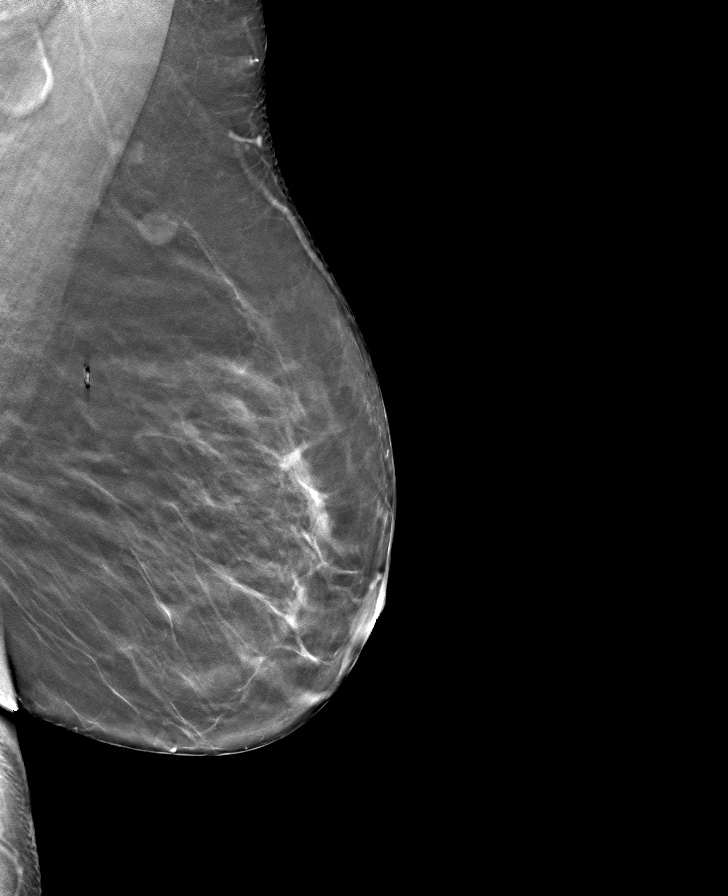

[L CC tomo · tomo slice 32/63.0]
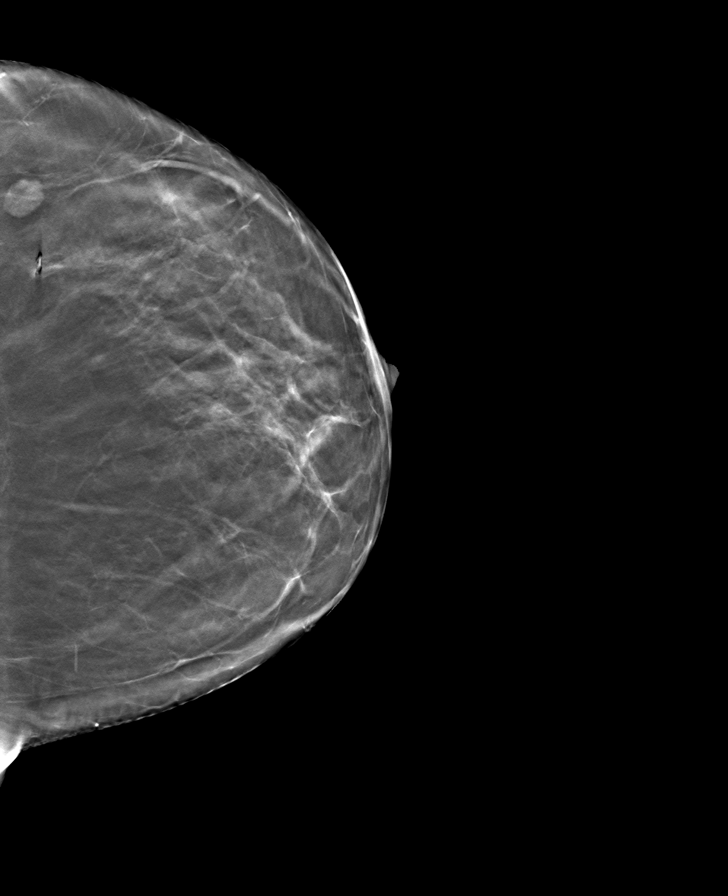

[R MLO tomo · tomo slice 35/68.0]
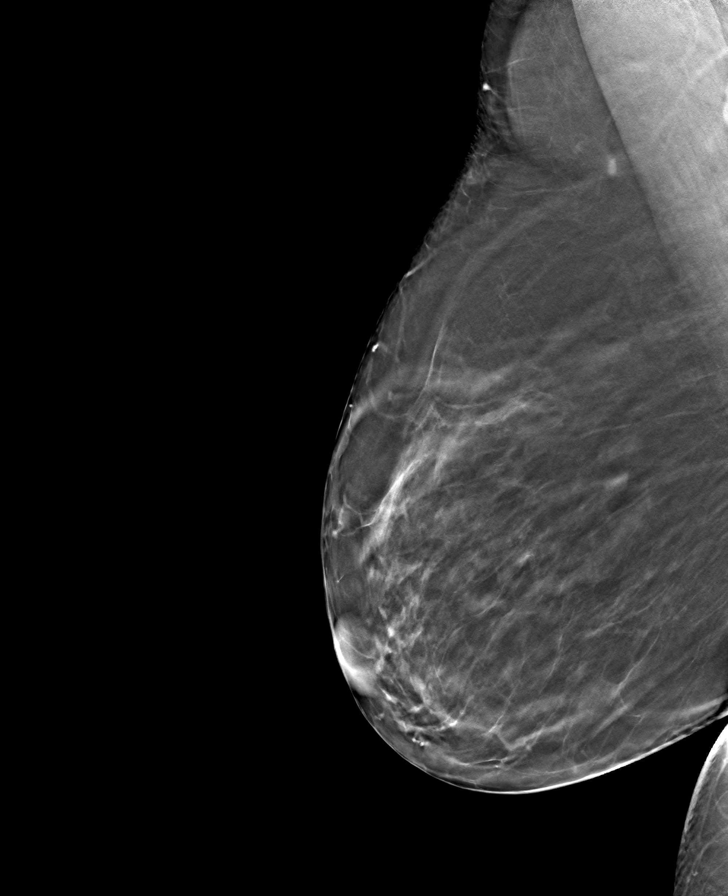

[R CC tomo · tomo slice 29/57.0]
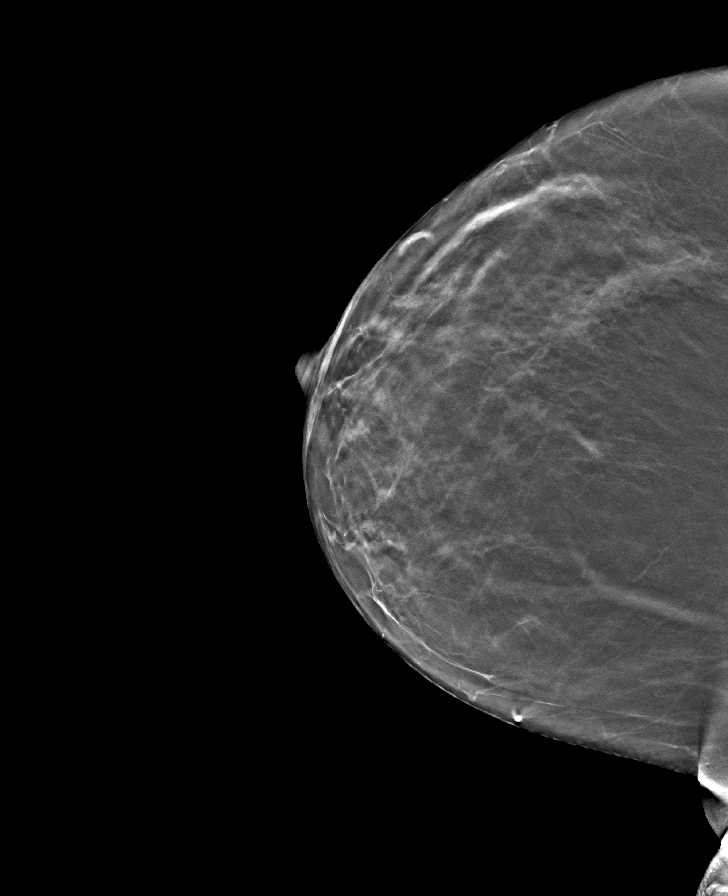

[8 of 24 positions shown; findings below may reference images not displayed]

ACR Breast Density Category b: There are scattered areas of
fibroglandular density.
FINDINGS: There are no findings suspicious for malignancy. Images were
processed with CAD.
IMPRESSION: No mammographic evidence of malignancy. A result letter of this
screening mammogram will be mailed directly to the patient.

RECOMMENDATION:
Screening mammogram in one year. (Code:[id number])

BI-RADS CATEGORY  1: Negative.
